# Patient Record
Sex: Female | Born: 1965 | Race: White | Hispanic: No | Marital: Single | Smoking: Former smoker
Health system: Southern US, Community
[De-identification: ages and names within clinical notes are randomized; demographics above are authoritative.]

## PROBLEM LIST (undated history)

## (undated) DIAGNOSIS — K219 Gastro-esophageal reflux disease without esophagitis: Secondary | ICD-10-CM

## (undated) DIAGNOSIS — M419 Scoliosis, unspecified: Secondary | ICD-10-CM

## (undated) DIAGNOSIS — F329 Major depressive disorder, single episode, unspecified: Secondary | ICD-10-CM

## (undated) DIAGNOSIS — M1990 Unspecified osteoarthritis, unspecified site: Secondary | ICD-10-CM

## (undated) DIAGNOSIS — D241 Benign neoplasm of right breast: Secondary | ICD-10-CM

## (undated) DIAGNOSIS — F419 Anxiety disorder, unspecified: Secondary | ICD-10-CM

## (undated) DIAGNOSIS — S73006A Unspecified dislocation of unspecified hip, initial encounter: Secondary | ICD-10-CM

## (undated) HISTORY — DX: Major depressive disorder, single episode, unspecified: F32.9

## (undated) HISTORY — DX: Benign neoplasm of right breast: D24.1

## (undated) HISTORY — PX: TONSILLECTOMY: SUR1361

## (undated) HISTORY — PX: BREAST EXCISIONAL BIOPSY: SUR124

## (undated) HISTORY — PX: WISDOM TOOTH EXTRACTION: SHX21

## (undated) HISTORY — DX: Scoliosis, unspecified: M41.9

## (undated) HISTORY — DX: Anxiety disorder, unspecified: F41.9

---

## 2010-06-15 ENCOUNTER — Ambulatory Visit: Payer: Self-pay

## 2010-11-15 ENCOUNTER — Ambulatory Visit: Payer: Self-pay | Admitting: Family Medicine

## 2011-04-07 ENCOUNTER — Ambulatory Visit: Payer: Self-pay

## 2012-05-08 ENCOUNTER — Ambulatory Visit: Payer: Self-pay

## 2012-05-28 ENCOUNTER — Ambulatory Visit: Payer: Self-pay | Admitting: Surgery

## 2012-05-31 LAB — PATHOLOGY REPORT

## 2012-08-30 ENCOUNTER — Ambulatory Visit: Payer: Self-pay | Admitting: Surgery

## 2013-03-22 ENCOUNTER — Ambulatory Visit: Payer: Self-pay | Admitting: Podiatry

## 2013-04-05 ENCOUNTER — Ambulatory Visit: Payer: Self-pay | Admitting: Podiatry

## 2013-04-16 ENCOUNTER — Ambulatory Visit: Payer: Self-pay | Admitting: Podiatry

## 2013-05-03 ENCOUNTER — Ambulatory Visit: Payer: No Typology Code available for payment source | Admitting: Podiatry

## 2013-05-10 ENCOUNTER — Ambulatory Visit (INDEPENDENT_AMBULATORY_CARE_PROVIDER_SITE_OTHER): Payer: No Typology Code available for payment source | Admitting: Podiatry

## 2013-05-10 ENCOUNTER — Encounter: Payer: Self-pay | Admitting: Podiatry

## 2013-05-10 VITALS — BP 110/69 | HR 89 | Resp 16 | Ht 63.0 in | Wt 135.0 lb

## 2013-05-10 DIAGNOSIS — L600 Ingrowing nail: Secondary | ICD-10-CM

## 2013-05-10 DIAGNOSIS — M7750 Other enthesopathy of unspecified foot: Secondary | ICD-10-CM

## 2013-05-10 DIAGNOSIS — M2040 Other hammer toe(s) (acquired), unspecified foot: Secondary | ICD-10-CM

## 2013-05-10 NOTE — Patient Instructions (Addendum)

## 2013-05-10 NOTE — Progress Notes (Signed)
Subjective:     Patient ID: Shelly Ward, female   DOB: 1965/09/12, 48 y.o.   MRN: [id number]  HPI patient states that I have ingrown toenails on both my big toes a lesion on the left third toe and painful feet in general secondary to my foot structure. States she's tried to trim out these nailbeds has had removed twice without permanent procedure and they continue to give her trouble   Review of Systems  All other systems reviewed and are negative.       Objective:   Physical Exam  Nursing note and vitals reviewed. Constitutional: She is oriented to person, place, and time.  Cardiovascular: Intact distal pulses.   Musculoskeletal: Normal range of motion.  Neurological: She is oriented to person, place, and time.  Skin: Skin is warm.   neurovascular status is found to be intact with muscle strength adequate range of motion within normal limits and no equinus condition noted. Patient is found to have incurvated hallux nails of both feet that are painful when pressed and is found to have keratotic lesion distal third left foot. I noted there to be discomfort in the metatarsal heads of both feet with pain along the tendon complex      Assessment:     Ingrown toenail deformity hallux of both feet along with tendinitis of both feet and hammertoe deformity third left foot    Plan:     H&P performed and conditions discussed. I have recommended removal of the ingrown toenails and explained procedures going over risks associated with surgery. I infiltrated each hallux 60 mg Xylocaine Marcaine mixture removed the medial borders exposed matrix and applied chemical phenol 3 applications 30 seconds followed by alcohol lavaged and sterile dressing. Debridement lesion third toe left and scanned for custom orthotics to reduce stress against both feet and we'll see back for orthotic dispense

## 2013-05-10 NOTE — Progress Notes (Signed)
   Subjective:    Patient ID: Shelly Ward, female    DOB: 04/14/1965, 48 y.o.   MRN: [id number]  HPI Comments: i have some trouble with ingrown toenails and also some toenails that are growing funny Right great toe medial corner was infected a while back but that had cleared up. The toenails on the left foot grow straight up .      Review of Systems  All other systems reviewed and are negative.       Objective:   Physical Exam        Assessment & Plan:

## 2013-05-22 ENCOUNTER — Encounter: Payer: Self-pay | Admitting: Podiatry

## 2013-05-24 ENCOUNTER — Ambulatory Visit (INDEPENDENT_AMBULATORY_CARE_PROVIDER_SITE_OTHER): Payer: No Typology Code available for payment source | Admitting: Podiatry

## 2013-05-24 ENCOUNTER — Ambulatory Visit: Payer: Self-pay | Admitting: Family Medicine

## 2013-05-24 VITALS — BP 116/72 | HR 91 | Resp 16 | Ht 63.0 in | Wt 138.0 lb

## 2013-05-24 DIAGNOSIS — M779 Enthesopathy, unspecified: Secondary | ICD-10-CM

## 2013-05-24 NOTE — Progress Notes (Signed)
Subjective:     Patient ID: Shelly Ward, female   DOB: 07-13-1965, 48 y.o.   MRN: [id number]  HPI patient presents with painful feet and is here to pick up insert   Review of Systems     Objective:   Physical Exam Neurovascular status intact with no change in health history and pain plantar feet that has not changed that much from previous visit    Assessment:     Plantar fasciitis tendinitis that is improved stable but still mildly to moderately symptomatic    Plan:     Dispensed orthotics with instructions and reviewed with her physical therapy and shoe gear modifications and reappoint 6 weeks

## 2013-05-24 NOTE — Patient Instructions (Signed)

## 2013-06-27 ENCOUNTER — Encounter: Payer: Self-pay | Admitting: Podiatry

## 2013-06-28 ENCOUNTER — Ambulatory Visit: Payer: No Typology Code available for payment source | Admitting: Podiatry

## 2014-06-30 ENCOUNTER — Ambulatory Visit
Admit: 2014-06-30 | Disposition: A | Discharge status: Home / Self Care | Payer: Self-pay | Attending: Urgent Care | Admitting: Urgent Care

## 2014-08-21 ENCOUNTER — Other Ambulatory Visit: Payer: Self-pay

## 2014-08-21 DIAGNOSIS — F419 Anxiety disorder, unspecified: Secondary | ICD-10-CM

## 2014-08-21 HISTORY — DX: Anxiety disorder, unspecified: F41.9

## 2014-08-21 MED ORDER — FLUOXETINE HCL 10 MG PO CAPS: 10.0000 mg | ORAL_CAPSULE | Freq: Two times a day (BID) | ORAL | Status: DC

## 2014-08-28 ENCOUNTER — Other Ambulatory Visit: Payer: Self-pay

## 2015-02-12 ENCOUNTER — Ambulatory Visit (INDEPENDENT_AMBULATORY_CARE_PROVIDER_SITE_OTHER): Payer: Self-pay | Admitting: Family Medicine

## 2015-02-12 ENCOUNTER — Encounter: Payer: Self-pay | Admitting: Family Medicine

## 2015-02-12 VITALS — BP 110/70 | HR 80 | Ht 63.0 in | Wt 145.0 lb

## 2015-02-12 DIAGNOSIS — S76911A Strain of unspecified muscles, fascia and tendons at thigh level, right thigh, initial encounter: Secondary | ICD-10-CM

## 2015-02-12 DIAGNOSIS — F419 Anxiety disorder, unspecified: Secondary | ICD-10-CM

## 2015-02-12 MED ORDER — ETODOLAC 400 MG PO TABS: 400.0000 mg | ORAL_TABLET | Freq: Two times a day (BID) | ORAL | Status: DC

## 2015-02-12 MED ORDER — FLUOXETINE HCL 10 MG PO CAPS: 10.0000 mg | ORAL_CAPSULE | Freq: Every day | ORAL | Status: DC

## 2015-02-12 NOTE — Progress Notes (Signed)
Name: Shelly Ward   MRN: [id number]    DOB: 08-29-1965   Date:02/12/2015       Progress Note  Subjective  Chief Complaint  Chief Complaint  Patient presents with  . Hip Pain    R) hip has been hurting off and on x 1 year but worse x 1 week    Hip Pain  The incident occurred more than 1 week ago. The pain is present in the right thigh, right hip and right knee. The quality of the pain is described as aching. The pain is at a severity of 5/10. The pain is moderate. The pain has been intermittent since onset. Associated symptoms include tingling. Pertinent negatives include no inability to bear weight, loss of motion, loss of sensation, muscle weakness or numbness. The symptoms are aggravated by movement. She has tried acetaminophen and NSAIDs (blue emu) for the symptoms. The treatment provided mild relief.  Depression        This is a chronic problem.  The current episode started more than 1 year ago.   The problem occurs every several days.  The problem has been gradually improving since onset.  Associated symptoms include no decreased concentration, no fatigue, no helplessness, no hopelessness, does not have insomnia, not irritable, no restlessness, no decreased interest, no appetite change, no body aches, no myalgias, no headaches, no indigestion, not sad and no suicidal ideas.     The symptoms are aggravated by nothing.  Past treatments include SSRIs - Selective serotonin reuptake inhibitors.   No problem-specific assessment & plan notes found for this encounter.   No past medical history on file.  Past Surgical History  Procedure Laterality Date  . Tonsillectomy      No family history on file.  Social History   Social History  . Marital Status: Single    Spouse Name: N/A  . Number of Children: N/A  . Years of Education: N/A   Occupational History  . Not on file.   Social History Main Topics  . Smoking status: Never Smoker   . Smokeless tobacco: Never Used  .  Alcohol Use: No  . Drug Use: No  . Sexual Activity: Not on file   Other Topics Concern  . Not on file   Social History Narrative    No Known Allergies   Review of Systems  Constitutional: Negative for fever, chills, weight loss, malaise/fatigue, appetite change and fatigue.  HENT: Negative for ear discharge, ear pain and sore throat.   Eyes: Negative for blurred vision.  Respiratory: Negative for cough, sputum production, shortness of breath and wheezing.   Cardiovascular: Negative for chest pain, palpitations and leg swelling.  Gastrointestinal: Negative for heartburn, nausea, abdominal pain, diarrhea, constipation, blood in stool and melena.  Genitourinary: Negative for dysuria, urgency, frequency and hematuria.  Musculoskeletal: Negative for myalgias, back pain, joint pain and neck pain.  Skin: Negative for rash.  Neurological: Positive for tingling. Negative for dizziness, sensory change, focal weakness, numbness and headaches.  Endo/Heme/Allergies: Negative for environmental allergies and polydipsia. Does not bruise/bleed easily.  Psychiatric/Behavioral: Negative for depression, suicidal ideas and decreased concentration. The patient is not nervous/anxious and does not have insomnia.      Objective  Filed Vitals:   02/12/15 1456  BP: 110/70  Pulse: 80  Height: 5\' 3"  (1.6 m)  Weight: 145 lb (65.772 kg)    Physical Exam  Constitutional: She is well-developed, well-nourished, and in no distress. She is not irritable. No distress.  HENT:  Head: Normocephalic and atraumatic.  Right Ear: External ear normal.  Left Ear: External ear normal.  Nose: Nose normal.  Mouth/Throat: Oropharynx is clear and moist.  Eyes: Conjunctivae and EOM are normal. Pupils are equal, round, and reactive to light. Right eye exhibits no discharge. Left eye exhibits no discharge.  Neck: Normal range of motion. Neck supple. No JVD present. No thyromegaly present.  Cardiovascular: Normal rate,  regular rhythm, normal heart sounds and intact distal pulses.  Exam reveals no gallop and no friction rub.   No murmur heard. Pulmonary/Chest: Effort normal and breath sounds normal.  Abdominal: Soft. Bowel sounds are normal. She exhibits no mass. There is no tenderness. There is no guarding.  Musculoskeletal: Normal range of motion. She exhibits no edema.  Lymphadenopathy:    She has no cervical adenopathy.  Neurological: She is alert. She has normal motor skills, normal sensation, normal strength and normal reflexes. She has a normal Straight Leg Raise Test.  Skin: Skin is warm and dry. She is not diaphoretic.  Psychiatric: Mood and affect normal.      Assessment & Plan  Problem List Items Addressed This Visit    None    Visit Diagnoses    Chronic anxiety    -  Primary    Relevant Medications    FLUoxetine (PROZAC) 10 MG capsule    Muscle strain of thigh, right, initial encounter        Relevant Medications    etodolac (LODINE) 400 MG tablet         Dr. Deanna Jones [facility]  02/12/2015

## 2015-02-19 ENCOUNTER — Other Ambulatory Visit: Payer: Self-pay

## 2015-02-19 DIAGNOSIS — M545 Low back pain: Secondary | ICD-10-CM

## 2015-02-19 MED ORDER — CYCLOBENZAPRINE HCL 10 MG PO TABS: 10.0000 mg | ORAL_TABLET | Freq: Three times a day (TID) | ORAL | Status: DC | PRN

## 2015-03-13 ENCOUNTER — Other Ambulatory Visit: Payer: Self-pay

## 2015-03-13 DIAGNOSIS — M25551 Pain in right hip: Secondary | ICD-10-CM

## 2015-03-18 ENCOUNTER — Other Ambulatory Visit: Payer: Self-pay | Admitting: Family Medicine

## 2015-03-19 ENCOUNTER — Other Ambulatory Visit: Payer: Self-pay

## 2015-03-19 MED ORDER — FLUOXETINE HCL 10 MG PO TABS: 10.0000 mg | ORAL_TABLET | Freq: Two times a day (BID) | ORAL | Status: DC

## 2015-05-31 ENCOUNTER — Ambulatory Visit
Admission: EM | Admit: 2015-05-31 | Discharge: 2015-05-31 | Disposition: A | Discharge status: Home / Self Care | Payer: Self-pay | Attending: Family Medicine | Admitting: Family Medicine

## 2015-05-31 DIAGNOSIS — M5441 Lumbago with sciatica, right side: Secondary | ICD-10-CM

## 2015-05-31 HISTORY — DX: Unspecified dislocation of unspecified hip, initial encounter: S73.006A

## 2015-05-31 HISTORY — DX: Anxiety disorder, unspecified: F41.9

## 2015-05-31 MED ORDER — CYCLOBENZAPRINE HCL 10 MG PO TABS: 10.0000 mg | ORAL_TABLET | Freq: Three times a day (TID) | ORAL | Status: DC | PRN

## 2015-05-31 NOTE — Discharge Instructions (Signed)
You may use the muscle relaxers again at bedtime for a few days as needed Use Ibuprofen 200 mg   2-3 tablets up to 3 x day with meals for 3 days  Take prednisone you already have 2 tablets today and 2 tomorrow Ice packs to right low back./buttocks  Back Exercises If you have pain in your back, do these exercises 2-3 times each day or as told by your doctor. When the pain goes away, do the exercises once each day, but repeat the steps more times for each exercise (do more repetitions). If you do not have pain in your back, do these exercises once each day or as told by your doctor. EXERCISES Single Knee to Chest Do these steps 3-5 times in a row for each leg: 1. Lie on your back on a firm bed or the floor with your legs stretched out. 2. Bring one knee to your chest. 3. Hold your knee to your chest by grabbing your knee or thigh. 4. Pull on your knee until you feel a gentle stretch in your lower back. 5. Keep doing the stretch for 10-30 seconds. 6. Slowly let go of your leg and straighten it. Pelvic Tilt Do these steps 5-10 times in a row: 1. Lie on your back on a firm bed or the floor with your legs stretched out. 2. Bend your knees so they point up to the ceiling. Your feet should be flat on the floor. 3. Tighten your lower belly (abdomen) muscles to press your lower back against the floor. This will make your tailbone point up to the ceiling instead of pointing down to your feet or the floor. 4. Stay in this position for 5-10 seconds while you gently tighten your muscles and breathe evenly. Cat-Cow Do these steps until your lower back bends more easily: 1. Get on your hands and knees on a firm surface. Keep your hands under your shoulders, and keep your knees under your hips. You may put padding under your knees. 2. Let your head hang down, and make your tailbone point down to the floor so your lower back is round like the back of a cat. 3. Stay in this position for 5 seconds. 4. Slowly  lift your head and make your tailbone point up to the ceiling so your back hangs low (sags) like the back of a cow. 5. Stay in this position for 5 seconds. Press-Ups Do these steps 5-10 times in a row: 1. Lie on your belly (face-down) on the floor. 2. Place your hands near your head, about shoulder-width apart. 3. While you keep your back relaxed and keep your hips on the floor, slowly straighten your arms to raise the top half of your body and lift your shoulders. Do not use your back muscles. To make yourself more comfortable, you may change where you place your hands. 4. Stay in this position for 5 seconds. 5. Slowly return to lying flat on the floor. Bridges Do these steps 10 times in a row: 1. Lie on your back on a firm surface. 2. Bend your knees so they point up to the ceiling. Your feet should be flat on the floor. 3. Tighten your butt muscles and lift your butt off of the floor until your waist is almost as high as your knees. If you do not feel the muscles working in your butt and the back of your thighs, slide your feet 1-2 inches farther away from your butt. 4. Stay in this position  for 3-5 seconds. 5. Slowly lower your butt to the floor, and let your butt muscles relax. If this exercise is too easy, try doing it with your arms crossed over your chest. Belly Crunches Do these steps 5-10 times in a row: 1. Lie on your back on a firm bed or the floor with your legs stretched out. 2. Bend your knees so they point up to the ceiling. Your feet should be flat on the floor. 3. Cross your arms over your chest. 4. Tip your chin a little bit toward your chest but do not bend your neck. 5. Tighten your belly muscles and slowly raise your chest just enough to lift your shoulder blades a tiny bit off of the floor. 6. Slowly lower your chest and your head to the floor. Back Lifts Do these steps 5-10 times in a row: 1. Lie on your belly (face-down) with your arms at your sides, and rest your  forehead on the floor. 2. Tighten the muscles in your legs and your butt. 3. Slowly lift your chest off of the floor while you keep your hips on the floor. Keep the back of your head in line with the curve in your back. Look at the floor while you do this. 4. Stay in this position for 3-5 seconds. 5. Slowly lower your chest and your face to the floor. GET HELP IF:  Your back pain gets a lot worse when you do an exercise.  Your back pain does not lessen 2 hours after you exercise. If you have any of these problems, stop doing the exercises. Do not do them again unless your doctor says it is okay. GET HELP RIGHT AWAY IF:  You have sudden, very bad back pain. If this happens, stop doing the exercises. Do not do them again unless your doctor says it is okay.   This information is not intended to replace advice given to you by your health care provider. Make sure you discuss any questions you have with your health care provider.   Document Released: 03/26/2010 Document Revised: 11/12/2014 Document Reviewed: 04/17/2014 Elsevier Interactive Patient Education [employer].

## 2015-05-31 NOTE — ED Provider Notes (Signed)
CSN: [id number]     Arrival date & time 05/31/15  [id number] History   First MD Initiated Contact with Patient 05/31/15 0845     Chief Complaint  Patient presents with  . Leg Pain    Specifically Right Ankle, Knee, Hip, and lower back   (Consider location/radiation/quality/duration/timing/severity/associated sxs/prior Treatment) HPI  50 yo F reports longstanding issues with low back, leg and hip  Pain. Generally  right sided. At times the knee can be uncomfortable as well. Today there is pain similar to past experience Today the ankle is also aching. She denies any recent trauma, and denies any recognized mis-step or stumble.  She can weightbear. Is ambulatory without assistance and drove herself here. 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  Reports using cyclobenzaprine at bedtime in the past intermittently  with good relief and good sleep. She brings and empty bottle of the same. And a bottle of a prednisone taper that still contains 2 pills she failed to finish. Also wants to ask about a long standing bunion on the right. Past Medical History  Diagnosis Date  . Anxiety   . Dislocated hip ([place])     Right Side at [facility]   Past Surgical History  Procedure Laterality Date  . Tonsillectomy     History reviewed. No pertinent family history. Social History  Substance Use Topics  . Smoking status: Never Smoker   . Smokeless tobacco: Never Used  . Alcohol Use: No   OB History    No data available     Review of Systems 10 Systems reviewed and are negative for acute change except as noted in the HPI.  Allergies  Review of patient's allergies indicates no known allergies.  Home Medications   Prior to Admission medications   Medication Sig Start Date End Date Taking? Authorizing Provider  cyclobenzaprine (FLEXERIL) 10 MG tablet Take 1 tablet (10 mg total) by mouth 3 (three) times daily as needed for muscle spasms. 05/31/15   Jan Fireman, PA-C  etodolac (LODINE) 400 MG  tablet Take 1 tablet (400 mg total) by mouth 2 (two) times daily. 02/12/15   Juline Patch, MD  FLUoxetine (PROZAC) 10 MG tablet Take 1 tablet (10 mg total) by mouth 2 (two) times daily. 03/19/15   Juline Patch, MD  Multiple Vitamin (MULTI-VITAMINS) TABS Take 1 tablet by mouth daily.    Historical Provider, MD   Meds Ordered and Administered this Visit  Medications - No data to display  BP 128/77 mmHg  Pulse 86  Temp(Src) 97.9 F (36.6 C) (Oral)  Resp 18  Ht 5\' 3"  (1.6 m)  Wt 135 lb (61.236 kg)  BMI 23.92 kg/m2  SpO2 100%  LMP 05/10/2015 No data found.   Physical Exam    General: NAD,  HEENT:normocephalic,atraumatic, mucous membranes moist,grossly normal hearing Eyes: EOMI, conjunctiva clear, conjugate gaze Neck: supple,no lymphadenopathy Resp : CT A, bilat; normal respiratory effort Card : RRR Abd:  Not distended Skin: no rash, skin intact MSK: no deformities, ambulatory without assistance. Indicates right low back sciatic region as area of discomfort; area somewhat tender to pressure palaption Right buttocks, right lateral thigh reported  as aching, no change with exam or activity. Can toe walk, heel walk, crouch and return without assistance. SLR is neg DTRs present and equal, good pulses. Plants right foot slightly differently than left with ambulation but travels without hesitancy. Wearing sneakers. Right foot with mild/mod bunion developing, some toe deviation-feet hurt on the job  Neuro : good attention,recall-good memory, fine tremor noted - patient denies awareness Psych: speech and behavior appropriate   ED Course  Procedures (including critical care time)  Labs Review Labs Reviewed - No data to display  Imaging Review No results found.  Discussed body mechanics and twist, torque, lift exacerbations for low back/sciatica problems. Anti-inflammatories and ice as needed. For the acute use one of her prednisone tonight and one tomorrow night. Cautioned that  in future needs to take complete tapers as directed-designed to step down. May use muscle relaxer at bedtime when needed  MDM   1. Right-sided low back pain with right-sided sciatica    Diagnosis and treatment discussed.-handout given-heat, ice, anti-inflammatories, athletic rubs-body mechanics !   Questions fielded, expectations and recommendations reviewed. Discussed follow up and return parameters including no resolution or any worsening condition. . Patient expresses understanding  and agrees to plan.  Will return to [facility] with questions, concerns or exacerbation.  Return to PCP [facility] care for longstanding problems as she will be better served w continuum of care.  Cyclobenaprine Rx submitted   Discharge Medication List as of 05/31/2015  9:08 AM        Jan Fireman, PA-C 06/03/15 1551

## 2015-05-31 NOTE — ED Notes (Addendum)
Patient c/o right ankle pain and pain behind her right knee which radiates to her hip, buttocks, and lower back.  Also, experiences numbness and tingling at times in her right ankle and right foot.  This pain has been happening on and off now for about a year now.  No injury or trauma.  No past surgery to the back or right leg.

## 2015-06-03 ENCOUNTER — Encounter: Payer: Self-pay | Admitting: Physician Assistant

## 2015-06-10 ENCOUNTER — Ambulatory Visit: Payer: Self-pay

## 2015-06-17 ENCOUNTER — Encounter: Payer: Self-pay | Admitting: *Deleted

## 2015-06-17 ENCOUNTER — Ambulatory Visit: Payer: Self-pay | Attending: Oncology | Admitting: *Deleted

## 2015-06-17 ENCOUNTER — Encounter (INDEPENDENT_AMBULATORY_CARE_PROVIDER_SITE_OTHER): Payer: Self-pay

## 2015-06-17 ENCOUNTER — Ambulatory Visit
Admission: RE | Admit: 2015-06-17 | Discharge: 2015-06-17 | Disposition: A | Discharge status: Home / Self Care | Payer: Self-pay | Source: Ambulatory Visit | Attending: Oncology | Admitting: Oncology

## 2015-06-17 VITALS — BP 108/68 | HR 80 | Temp 97.6°F | Resp 16 | Ht 62.99 in | Wt 140.7 lb

## 2015-06-17 DIAGNOSIS — Z0000 Encounter for general adult medical examination without abnormal findings: Secondary | ICD-10-CM

## 2015-06-17 NOTE — Patient Instructions (Signed)
Human Papillomavirus Human papillomavirus (HPV) is the most common sexually transmitted infection (STI) and is highly contagious. HPV infections cause genital warts and cancers to the outlet of the womb (cervix), birth canal (vagina), opening of the birth canal (vulva), and anus. There are over 100 types of HPV. Unless wartlike lesions are present in the throat or there are genital warts that you can see or feel, HPV usually does not cause symptoms. It is possible to be infected for long periods and pass it on to others without knowing it. CAUSES  HPV is spread from person to person through sexual contact. This includes oral, vaginal, or anal sex. RISK FACTORS  Having unprotected sex. HPV can be spread by oral, vaginal, or anal sex.  Having several sex partners.  Having a sex partner who has other sex partners.  Having or having had another sexually transmitted infection. SIGNS AND SYMPTOMS  Most people carrying HPV do not have any symptoms. If symptoms are present, symptoms may include:  Wartlike lesions in the throat (from having oral sex).  Warts in the infected skin or mucous membranes.  Genital warts that may itch, burn, or bleed.  Genital warts that may be painful or bleed during sexual intercourse. DIAGNOSIS  If wartlike lesions are present in the throat or genital warts are present, your health care provider can usually diagnose HPV by physical examination.   Genital warts are easily seen with the naked eye.  Currently, there is no FDA-approved test to detect HPV in males.  In females, a Pap test can show cells that are infected with HPV.  In females, a scope can be used to view the cervix (colposcopy). A colposcopy can be performed if the pelvic exam or Pap test is abnormal. A sample of tissue may be removed (biopsy) during the colposcopy. TREATMENT  There is no treatment for the virus itself. However, there are treatments for the health problems and symptoms HPV can cause.  Your health care provider will follow you closely after you are treated. This is because the HPV can come back and may need treatment again. Treatment of HPV may include:   Medicines, which may be injected or applied in a cream, lotion, or gel form.  Use of a probe to apply extreme cold (cryotherapy).  Application of an intense beam of light (laser treatment).  Use of a probe to apply extreme heat (electrocautery).  Surgery. HOME CARE INSTRUCTIONS   Take medicines only as directed by your health care provider.  Use over-the-counter creams for itching or irritation as directed by your health care provider.  Keep all follow-up visits as directed by your health care provider. This is important.  Do not touch or scratch the warts.  Do not treat genital warts with medicines used for treating hand warts.  Do not have sex while you are being treated.  Do not douche or use tampons during treatment of HPV.  Tell your sex partner about your infection because he or she may also need treatment.  If you become pregnant, tell your health care provider that you have had HPV. Your health care provider will monitor you closely during pregnancy to be sure your baby is safe.  After treatment, use condoms during sex to prevent future infections.  Have only one sex partner.  Have a sex partner who does not have other sex partners. PREVENTION   Talk to your health care provider about getting the HPV vaccines. These vaccines prevent some HPV infections and cancers.  It is recommended that the vaccine be given to males and females between the ages of 9 and 26 years old. It will not work if you already have HPV, and it is not recommended for pregnant women.  A Pap test is done to screen for cervical cancer in women.  The first Pap test should be done at age 21 years.  Between ages 21 and 29 years, Pap tests are repeated every 2 years.  Beginning at age 30, you are advised to have a Pap test every  3 years as long as your past 3 Pap tests have been normal.  Some women have medical problems that increase the chance of getting cervical cancer. Talk to your health care provider about these problems. It is especially important to talk to your health care provider if a new problem develops soon after your last Pap test. In these cases, your health care provider may recommend more frequent screening and Pap tests.  The above recommendations are the same for women who have or have not gotten the vaccine for HPV.  If you had a hysterectomy for a problem that was not a cancer or a condition that could lead to cancer, then you no longer need Pap tests. However, even if you no longer need a Pap test, a regular exam is a good idea to make sure no other problems are starting.   If you are between the ages of 65 and 70 years and you have had normal Pap tests going back 10 years, you no longer need Pap tests. However, even if you no longer need a Pap test, a regular exam is a good idea to make sure no other problems are starting.  If you have had past treatment for cervical cancer or a condition that could lead to cancer, you need Pap tests and screening for cancer for at least 20 years after your treatment.  If Pap tests have been discontinued, risk factors (such as a new sexual partner)need to be reassessed to determine if screening should be resumed.  Some women may need screenings more often if they are at high risk for cervical cancer. SEEK MEDICAL CARE IF:   The treated skin becomes red, swollen, or painful.  You have a fever.  You feel generally ill.  You feel lumps or pimple-like projections in and around your genital area.  You develop bleeding of the vagina or the treatment area.  You have painful sexual intercourse. MAKE SURE YOU:   Understand these instructions.  Will watch your condition.  Will get help if you are not doing well or get worse.   This information is not  intended to replace advice given to you by your health care provider. Make sure you discuss any questions you have with your health care provider.   Document Released: 05/14/2003 Document Revised: 03/14/2014 Document Reviewed: 05/29/2013 Elsevier Interactive Patient Education 2016 [employer]vier Inc[employer]   Gave patient hand-out, Women Staying Healthy, Active and Well from [facility], with education on breast health, pap smears, heart and colon health. 

## 2015-06-17 NOTE — Progress Notes (Signed)
Subjective:     Patient ID: Shelly Ward, female   DOB: 02-08-1966, 50 y.o.   MRN: [id number]  HPI   Review of Systems     Objective:   Physical Exam  Pulmonary/Chest: Right breast exhibits no inverted nipple, no mass, no nipple discharge, no skin change and no tenderness. Left breast exhibits no inverted nipple, no mass, no nipple discharge, no skin change and no tenderness.  Abdominal: There is no splenomegaly or hepatomegaly.  Genitourinary: Rectal exam shows no internal hemorrhoid. There is no rash, tenderness, lesion or injury on the right labia. There is no rash, tenderness, lesion or injury on the left labia. Cervix exhibits no motion tenderness, no discharge and no friability. Right adnexum displays no mass, no tenderness and no fullness. Left adnexum displays no mass and no fullness. No erythema, tenderness or bleeding in the vagina. No foreign body around the vagina. No signs of injury around the vagina. No vaginal discharge found.  Firm dark brown stool noted on rectal exam       Assessment:     50 year old White female returns to [facility] for annual screening.  Clinical breast exam unremarkable.  Taught self breast awareness.  Specimen collected for pap smear with some difficulty.  States she has not been sexually active in a long time.  Complains of painful intercourse.   Discussed vaginal dilators and lubricants to increase comfort during intercourse. Patient has been screened for eligibility.  She does not have any insurance, Medicare or Medicaid.  She also meets financial eligibility.  Hand-out given on the Affordable Care Act.     Plan:     Screening mammogram ordered.  Specimen sent to the lab.  Will follow-up per [facility] protocol.

## 2015-06-22 LAB — PAP LB AND HPV HIGH-RISK
HPV, high-risk: NEGATIVE
PAP SMEAR COMMENT: 0

## 2015-06-23 ENCOUNTER — Encounter: Payer: Self-pay | Admitting: *Deleted

## 2015-06-23 NOTE — Progress Notes (Signed)
Letter mailed to inform patient of her normal mammogram and pap smear.  Next mammo due in one year and pap due in 5 years.  HSIS to Christy. 

## 2015-09-28 ENCOUNTER — Other Ambulatory Visit: Payer: Self-pay | Admitting: Family Medicine

## 2016-01-27 ENCOUNTER — Ambulatory Visit (INDEPENDENT_AMBULATORY_CARE_PROVIDER_SITE_OTHER): Payer: Self-pay | Admitting: Family Medicine

## 2016-01-27 ENCOUNTER — Ambulatory Visit: Payer: Self-pay | Admitting: Family Medicine

## 2016-01-27 ENCOUNTER — Encounter: Payer: Self-pay | Admitting: Family Medicine

## 2016-01-27 VITALS — BP 120/78 | HR 84 | Ht 62.0 in | Wt 139.0 lb

## 2016-01-27 DIAGNOSIS — F419 Anxiety disorder, unspecified: Secondary | ICD-10-CM

## 2016-01-27 DIAGNOSIS — J40 Bronchitis, not specified as acute or chronic: Secondary | ICD-10-CM

## 2016-01-27 DIAGNOSIS — J0100 Acute maxillary sinusitis, unspecified: Secondary | ICD-10-CM

## 2016-01-27 MED ORDER — FLUOXETINE HCL 10 MG PO TABS: 10.0000 mg | ORAL_TABLET | Freq: Two times a day (BID) | ORAL | 5 refills | Status: DC

## 2016-01-27 MED ORDER — AMOXICILLIN 500 MG PO CAPS: 500.0000 mg | ORAL_CAPSULE | Freq: Three times a day (TID) | ORAL | 0 refills | Status: DC

## 2016-01-27 MED ORDER — FLUOXETINE HCL 10 MG PO TABS: 10.0000 mg | ORAL_TABLET | Freq: Two times a day (BID) | ORAL | 1 refills | Status: DC

## 2016-01-27 MED ORDER — GUAIFENESIN-CODEINE 100-10 MG/5ML PO SYRP: 5.0000 mL | ORAL_SOLUTION | Freq: Three times a day (TID) | ORAL | 0 refills | Status: DC | PRN

## 2016-01-27 NOTE — Progress Notes (Signed)
Name: Shelly Ward   MRN: [id number]    DOB: 06/25/1965   Date:01/27/2016       Progress Note  Subjective  Chief Complaint  Chief Complaint  Patient presents with  . Sinusitis    cough and cong x 2 weeks- mucinex hasn't helped  . Anxiety    refill Fluoxetine    Sinusitis  This is a new problem. The current episode started 1 to 4 weeks ago. The problem has been gradually worsening since onset. There has been no fever. The pain is mild. Associated symptoms include chills, congestion, coughing, headaches, sinus pressure and a sore throat. Pertinent negatives include no diaphoresis, ear pain, hoarse voice, neck pain, shortness of breath, sneezing or swollen glands. Treatments tried: mucinex d. The treatment provided mild relief.  Anxiety  Presents for follow-up visit. Symptoms include nervous/anxious behavior. Patient reports no chest pain, compulsions, confusion, decreased concentration, depressed mood, dizziness, dry mouth, excessive worry, feeling of choking, hyperventilation, impotence, insomnia, irritability, malaise, muscle tension, nausea, palpitations, panic, restlessness, shortness of breath or suicidal ideas. Symptoms occur occasionally. The severity of symptoms is mild. The quality of sleep is good. Nighttime awakenings: none.      No problem-specific Assessment & Plan notes found for this encounter.   Past Medical History:  Diagnosis Date  . Anxiety   . Dislocated hip ([place])    Right Side at [facility]    Past Surgical History:  Procedure Laterality Date  . BREAST BIOPSY Right   . TONSILLECTOMY      No family history on file.  Social History   Social History  . Marital status: Single    Spouse name: N/A  . Number of children: N/A  . Years of education: N/A   Occupational History  . Not on file.   Social History Main Topics  . Smoking status: Never Smoker  . Smokeless tobacco: Never Used  . Alcohol use No  . Drug use: No  . Sexual activity: Not on file    Other Topics Concern  . Not on file   Social History Narrative  . No narrative on file    No Known Allergies   Review of Systems  Constitutional: Positive for chills. Negative for diaphoresis, fever, irritability, malaise/fatigue and weight loss.  HENT: Positive for congestion, sinus pressure and sore throat. Negative for ear discharge, ear pain, hoarse voice and sneezing.   Eyes: Negative for blurred vision.  Respiratory: Positive for cough. Negative for sputum production, shortness of breath and wheezing.   Cardiovascular: Negative for chest pain, palpitations and leg swelling.  Gastrointestinal: Negative for abdominal pain, blood in stool, constipation, diarrhea, heartburn, melena and nausea.  Genitourinary: Negative for dysuria, frequency, hematuria, impotence and urgency.  Musculoskeletal: Negative for back pain, joint pain, myalgias and neck pain.  Skin: Negative for rash.  Neurological: Positive for headaches. Negative for dizziness, tingling, sensory change and focal weakness.  Endo/Heme/Allergies: Negative for environmental allergies and polydipsia. Does not bruise/bleed easily.  Psychiatric/Behavioral: Negative for confusion, decreased concentration, depression and suicidal ideas. The patient is nervous/anxious. The patient does not have insomnia.      Objective  Vitals:   01/27/16 0849  BP: 120/78  Pulse: 84  Weight: 139 lb (63 kg)  Height: 5\' 2"  (1.575 m)    Physical Exam  Constitutional: She is well-developed, well-nourished, and in no distress. No distress.  HENT:  Head: Normocephalic and atraumatic.  Right Ear: External ear normal.  Left Ear: External ear normal.  Nose: Nose normal.  Right sinus exhibits no maxillary sinus tenderness. Left sinus exhibits no maxillary sinus tenderness.  Mouth/Throat: Oropharynx is clear and moist.  Eyes: Conjunctivae and EOM are normal. Pupils are equal, round, and reactive to light. Right eye exhibits no discharge. Left  eye exhibits no discharge.  Neck: Normal range of motion. Neck supple. No JVD present. No thyromegaly present.  Cardiovascular: Normal rate, regular rhythm, normal heart sounds and intact distal pulses.  Exam reveals no gallop and no friction rub.   No murmur heard. Pulmonary/Chest: Effort normal and breath sounds normal.  Abdominal: Soft. Bowel sounds are normal. She exhibits no mass. There is no tenderness. There is no guarding.  Musculoskeletal: Normal range of motion. She exhibits no edema.  Lymphadenopathy:    She has no cervical adenopathy.  Neurological: She is alert.  Skin: Skin is warm and dry. She is not diaphoretic.  Psychiatric: Mood and affect normal.  Nursing note and vitals reviewed.     Assessment & Plan  Problem List Items Addressed This Visit      Other   Acute anxiety   Relevant Medications   FLUoxetine (PROZAC) 10 MG tablet    Other Visit Diagnoses    Acute maxillary sinusitis, recurrence not specified    -  Primary   Relevant Medications   amoxicillin (AMOXIL) 500 MG capsule   guaiFENesin-codeine (ROBITUSSIN AC) 100-10 MG/5ML syrup   Bronchitis       Relevant Medications   guaiFENesin-codeine (ROBITUSSIN AC) 100-10 MG/5ML syrup        Dr. [facility]  01/27/16

## 2016-03-31 ENCOUNTER — Ambulatory Visit (INDEPENDENT_AMBULATORY_CARE_PROVIDER_SITE_OTHER): Payer: Self-pay | Admitting: Family Medicine

## 2016-03-31 ENCOUNTER — Encounter: Payer: Self-pay | Admitting: Family Medicine

## 2016-03-31 VITALS — BP 130/80 | HR 78 | Ht 62.0 in | Wt 138.0 lb

## 2016-03-31 DIAGNOSIS — M791 Myalgia, unspecified site: Secondary | ICD-10-CM

## 2016-03-31 MED ORDER — IBUPROFEN 600 MG PO TABS: 600.0000 mg | ORAL_TABLET | Freq: Three times a day (TID) | ORAL | 0 refills | Status: DC | PRN

## 2016-03-31 NOTE — Progress Notes (Signed)
Name: Shelly Ward   MRN: [id number]    DOB: 04/13/1965   Date:03/31/2016       Progress Note  Subjective  Chief Complaint  Chief Complaint  Patient presents with  . Leg Pain    hurting in the "groin" of the L) leg x 1 week- has been to ortho surg for R) leg pain    Leg Pain   The incident occurred 5 to 7 days ago (pulling on truck door). The injury mechanism is unknown. The pain is present in the left leg. The quality of the pain is described as aching. The pain is at a severity of 5/10. The pain is moderate. The pain has been fluctuating since onset. Pertinent negatives include no inability to bear weight, loss of motion, loss of sensation, muscle weakness, numbness or tingling. The symptoms are aggravated by palpation. The treatment provided no relief.    No problem-specific Assessment & Plan notes found for this encounter.   Past Medical History:  Diagnosis Date  . Anxiety   . Dislocated hip ([place])    Right Side at [facility]    Past Surgical History:  Procedure Laterality Date  . BREAST BIOPSY Right   . TONSILLECTOMY      History reviewed. No pertinent family history.  Social History   Social History  . Marital status: Single    Spouse name: N/A  . Number of children: N/A  . Years of education: N/A   Occupational History  . Not on file.   Social History Main Topics  . Smoking status: Never Smoker  . Smokeless tobacco: Never Used  . Alcohol use No  . Drug use: No  . Sexual activity: Not on file   Other Topics Concern  . Not on file   Social History Narrative  . No narrative on file    No Known Allergies   Review of Systems  Constitutional: Negative for chills, fever, malaise/fatigue and weight loss.  HENT: Negative for ear discharge, ear pain and sore throat.   Eyes: Negative for blurred vision.  Respiratory: Negative for cough, sputum production, shortness of breath and wheezing.   Cardiovascular: Negative for chest pain, palpitations and leg  swelling.  Gastrointestinal: Negative for abdominal pain, blood in stool, constipation, diarrhea, heartburn, melena and nausea.  Genitourinary: Negative for dysuria, frequency, hematuria and urgency.  Musculoskeletal: Negative for back pain, joint pain, myalgias and neck pain.  Skin: Negative for rash.  Neurological: Negative for dizziness, tingling, sensory change, focal weakness, numbness and headaches.  Endo/Heme/Allergies: Negative for environmental allergies and polydipsia. Does not bruise/bleed easily.  Psychiatric/Behavioral: Negative for depression and suicidal ideas. The patient is not nervous/anxious and does not have insomnia.      Objective  Vitals:   03/31/16 1357  BP: 130/80  Pulse: 78  Weight: 138 lb (62.6 kg)  Height: 5\' 2"  (1.575 m)    Physical Exam  Constitutional: She is well-developed, well-nourished, and in no distress. No distress.  HENT:  Head: Normocephalic and atraumatic.  Right Ear: External ear normal.  Left Ear: External ear normal.  Nose: Nose normal.  Mouth/Throat: Oropharynx is clear and moist.  Eyes: Conjunctivae and EOM are normal. Pupils are equal, round, and reactive to light. Right eye exhibits no discharge. Left eye exhibits no discharge.  Neck: Normal range of motion. Neck supple. No JVD present. No thyromegaly present.  Cardiovascular: Normal rate, regular rhythm, normal heart sounds and intact distal pulses.  Exam reveals no gallop and no friction rub.  No murmur heard. Pulmonary/Chest: Effort normal and breath sounds normal. She has no wheezes. She has no rales.  Abdominal: Soft. Bowel sounds are normal. There is no tenderness.  Musculoskeletal: She exhibits no edema.       Left hip: She exhibits decreased range of motion.  Pain with internal/external rotation left hip  Lymphadenopathy:    She has no cervical adenopathy.  Neurological: She is alert.  Skin: Skin is warm and dry. She is not diaphoretic.  Psychiatric: Mood and affect  normal.  Nursing note and vitals reviewed.     Assessment & Plan  Problem List Items Addressed This Visit    None    Visit Diagnoses    Myalgia    -  Primary   pt does not think it is her hip   Relevant Medications   ibuprofen (ADVIL,MOTRIN) 600 MG tablet        Dr. [facility]  03/31/16

## 2016-08-31 ENCOUNTER — Ambulatory Visit
Admission: RE | Admit: 2016-08-31 | Discharge: 2016-08-31 | Disposition: A | Discharge status: Home / Self Care | Payer: Self-pay | Source: Ambulatory Visit | Attending: Family Medicine | Admitting: Family Medicine

## 2016-08-31 ENCOUNTER — Ambulatory Visit (INDEPENDENT_AMBULATORY_CARE_PROVIDER_SITE_OTHER): Payer: Self-pay | Admitting: Family Medicine

## 2016-08-31 ENCOUNTER — Encounter: Payer: Self-pay | Admitting: Family Medicine

## 2016-08-31 VITALS — BP 127/88 | HR 86 | Temp 97.6°F | Ht 62.0 in | Wt 137.0 lb

## 2016-08-31 DIAGNOSIS — X58XXXA Exposure to other specified factors, initial encounter: Secondary | ICD-10-CM | POA: Insufficient documentation

## 2016-08-31 DIAGNOSIS — S93422A Sprain of deltoid ligament of left ankle, initial encounter: Secondary | ICD-10-CM | POA: Insufficient documentation

## 2016-08-31 DIAGNOSIS — M7989 Other specified soft tissue disorders: Secondary | ICD-10-CM | POA: Insufficient documentation

## 2016-08-31 MED ORDER — TRAMADOL HCL 50 MG PO TABS: 50.0000 mg | ORAL_TABLET | Freq: Three times a day (TID) | ORAL | 0 refills | Status: DC | PRN

## 2016-08-31 NOTE — Progress Notes (Signed)
Name: Shelly Ward   MRN: [id number]    DOB: Oct 13, 1965   Date:09/01/2016       Progress Note  Subjective  Chief Complaint  Chief Complaint  Patient presents with  . Foot Injury    Pt stated injured Lt foot have bruise/pain yesterday    Foot Injury   The incident occurred 6 to 12 hours ago. The incident occurred at home. The injury mechanism was a fall. The pain is present in the left ankle. The quality of the pain is described as aching. The pain is at a severity of 5/10. The pain is moderate. The pain has been intermittent since onset. Associated symptoms include an inability to bear weight and a loss of motion. Pertinent negatives include no loss of sensation, muscle weakness, numbness or tingling. The symptoms are aggravated by movement. She has tried acetaminophen and NSAIDs for the symptoms. The treatment provided moderate relief.    No problem-specific Assessment & Plan notes found for this encounter.   Past Medical History:  Diagnosis Date  . Anxiety   . Dislocated hip ([place])    Right Side at [facility]    Past Surgical History:  Procedure Laterality Date  . BREAST BIOPSY Right   . TONSILLECTOMY      No family history on file.  Social History   Social History  . Marital status: Single    Spouse name: N/A  . Number of children: N/A  . Years of education: N/A   Occupational History  . Not on file.   Social History Main Topics  . Smoking status: Never Smoker  . Smokeless tobacco: Never Used  . Alcohol use No  . Drug use: No  . Sexual activity: Not on file   Other Topics Concern  . Not on file   Social History Narrative  . No narrative on file    No Known Allergies  Outpatient Medications Prior to Visit  Medication Sig Dispense Refill  . diclofenac (VOLTAREN) 75 MG EC tablet Take 75 mg by mouth 2 (two) times daily. Dr Merrilyn Puma    . FLUoxetine (PROZAC) 10 MG tablet Take 1 tablet (10 mg total) by mouth 2 (two) times daily. 60 tablet 5  . ibuprofen  (ADVIL,MOTRIN) 600 MG tablet Take 1 tablet (600 mg total) by mouth every 8 (eight) hours as needed. 90 tablet 0  . Multiple Vitamin (MULTI-VITAMINS) TABS Take 1 tablet by mouth daily.     No facility-administered medications prior to visit.     Review of Systems  Constitutional: Negative for chills, fever, malaise/fatigue and weight loss.  HENT: Negative for ear discharge, ear pain and sore throat.   Eyes: Negative for blurred vision.  Respiratory: Negative for cough, sputum production, shortness of breath and wheezing.   Cardiovascular: Negative for chest pain, palpitations and leg swelling.  Gastrointestinal: Negative for abdominal pain, blood in stool, constipation, diarrhea, heartburn, melena and nausea.  Genitourinary: Negative for dysuria, frequency, hematuria and urgency.  Musculoskeletal: Positive for falls and joint pain. Negative for back pain, myalgias and neck pain.  Skin: Negative for rash.  Neurological: Negative for dizziness, tingling, sensory change, focal weakness, numbness and headaches.  Endo/Heme/Allergies: Negative for environmental allergies and polydipsia. Does not bruise/bleed easily.  Psychiatric/Behavioral: Negative for depression and suicidal ideas. The patient is not nervous/anxious and does not have insomnia.      Objective  Vitals:   08/31/16 1016  BP: 127/88  Pulse: 86  Temp: 97.6 F (36.4 C)  SpO2: 97%  Weight: 137 lb (62.1 kg)  Height: 5\' 2"  (1.575 m)    Physical Exam  Constitutional: She is well-developed, well-nourished, and in no distress. No distress.  HENT:  Head: Normocephalic and atraumatic.  Right Ear: External ear normal.  Left Ear: External ear normal.  Nose: Nose normal.  Mouth/Throat: Oropharynx is clear and moist.  Eyes: Conjunctivae and EOM are normal. Pupils are equal, round, and reactive to light. Right eye exhibits no discharge. Left eye exhibits no discharge.  Neck: Normal range of motion. Neck supple. No JVD present. No  thyromegaly present.  Cardiovascular: Normal rate, regular rhythm, normal heart sounds and intact distal pulses.  Exam reveals no gallop and no friction rub.   No murmur heard. Pulmonary/Chest: Effort normal and breath sounds normal. She has no wheezes. She has no rales.  Abdominal: Soft. Bowel sounds are normal. She exhibits no mass. There is no tenderness. There is no guarding.  Musculoskeletal: Normal range of motion. She exhibits no edema.       Left ankle: She exhibits swelling. Tenderness. Medial malleolus tenderness found.       Feet:  Ecchymosis/tender deloid ligament  Lymphadenopathy:    She has no cervical adenopathy.  Neurological: She is alert. She has normal reflexes.  Skin: Skin is warm and dry. She is not diaphoretic.  Psychiatric: Mood and affect normal.  Nursing note and vitals reviewed.     Assessment & Plan  Problem List Items Addressed This Visit    None    Visit Diagnoses    Sprain, ankle joint, medial, left, initial encounter    -  Primary   Relevant Medications   traMADol (ULTRAM) 50 MG tablet   Other Relevant Orders   DG Ankle Complete Left (Completed)      Meds ordered this encounter  Medications  . traMADol (ULTRAM) 50 MG tablet    Sig: Take 1 tablet (50 mg total) by mouth every 8 (eight) hours as needed.    Dispense:  30 tablet    Refill:  0      Dr. Jolean [facility]ari[facility]  09/01/16

## 2016-08-31 NOTE — Patient Instructions (Signed)
Ankle Sprain An ankle sprain is a stretch or tear in one of the tough, fiber-like tissues (ligaments) in the ankle. The ligaments in your ankle help to hold the bones of the ankle together. What are the causes? This condition is often caused by stepping on or falling on the outer edge of the foot. What increases the risk? This condition is more likely to develop in people who play sports. What are the signs or symptoms? Symptoms of this condition include:  Pain in your ankle.  Swelling.  Bruising. Bruising may develop right after you sprain your ankle or 1-2 days later.  Trouble standing or walking, especially when you turn or change directions. How is this diagnosed? This condition is diagnosed with a physical exam. During the exam, your health care provider will press on certain parts of your foot and ankle and try to move them in certain ways. X-rays may be taken to see how severe the sprain is and to check for broken bones. How is this treated? This condition may be treated with:  A brace. This is used to keep the ankle from moving until it heals.  An elastic bandage. This is used to support the ankle.  Crutches.  Pain medicine.  Surgery. This may be needed if the sprain is severe.  Physical therapy. This may help to improve the range of motion in the ankle. Follow these instructions at home:  Rest your ankle.  Take over-the-counter and prescription medicines only as told by your health care provider.  For 2-3 days, keep your ankle raised (elevated) above the level of your heart as much as possible.  If directed, apply ice to the area:  Put ice in a plastic bag.  Place a towel between your skin and the bag.  Leave the ice on for 20 minutes, 2-3 times a day.  If you were given a brace:  Wear it as directed.  Remove it to shower or bathe.  Try not to move your ankle much, but wiggle your toes from time to time. This helps to prevent swelling.  If you were  given an elastic bandage (dressing):  Remove it to shower or bathe.  Try not to move your ankle much, but wiggle your toes from time to time. This helps to prevent swelling.  Adjust the dressing to make it more comfortable if it feels too tight.  Loosen the dressing if you have numbness or tingling in your foot, or if your foot becomes cold and blue.  If you have crutches, use them as told by your health care provider. Continue to use them until you can walk without feeling pain in your ankle. Contact a health care provider if:  You have rapidly increasing bruising or swelling.  Your pain is not relieved with medicine. Get help right away if:  Your toes or foot becomes numb or blue.  You have severe pain that gets worse. This information is not intended to replace advice given to you by your health care provider. Make sure you discuss any questions you have with your health care provider. Document Released: 02/21/2005 Document Revised: 07/01/2015 Document Reviewed: 09/23/2014 Elsevier Interactive Patient Education  2017 Elsevier Inc.  

## 2016-10-10 ENCOUNTER — Ambulatory Visit: Payer: Self-pay

## 2016-11-14 ENCOUNTER — Ambulatory Visit: Payer: Self-pay

## 2016-12-12 ENCOUNTER — Other Ambulatory Visit: Payer: Self-pay | Admitting: *Deleted

## 2016-12-12 ENCOUNTER — Ambulatory Visit
Admission: RE | Admit: 2016-12-12 | Discharge: 2016-12-12 | Disposition: A | Discharge status: Home / Self Care | Payer: Self-pay | Source: Ambulatory Visit | Attending: Oncology | Admitting: Oncology

## 2016-12-12 ENCOUNTER — Ambulatory Visit: Payer: Self-pay | Attending: Oncology | Admitting: *Deleted

## 2016-12-12 ENCOUNTER — Encounter: Payer: Self-pay | Admitting: *Deleted

## 2016-12-12 VITALS — BP 111/76 | HR 80 | Temp 98.2°F | Ht 62.0 in | Wt 140.0 lb

## 2016-12-12 DIAGNOSIS — Z0000 Encounter for general adult medical examination without abnormal findings: Secondary | ICD-10-CM

## 2016-12-12 DIAGNOSIS — N63 Unspecified lump in unspecified breast: Secondary | ICD-10-CM

## 2016-12-12 NOTE — Patient Instructions (Signed)
Gave patient hand-out, Women Staying Healthy, Active and Well from [facility], with education on breast health, pap smears, heart and colon health. 

## 2016-12-12 NOTE — Progress Notes (Signed)
Subjective:     Patient ID: Shelly Ward, female   DOB: [birth year], 51 y.o.   MRN: [id number]  HPI   Review of Systems     Objective:   Physical Exam  Pulmonary/Chest: Right breast exhibits no inverted nipple, no mass, no nipple discharge, no skin change and no tenderness. Left breast exhibits no inverted nipple, no mass, no nipple discharge, no skin change and no tenderness. Breasts are symmetrical.       Assessment:     51 year old White female returns to [facility] for annual screening.  Clinical breast exam unremarkable.  Taught self breast awareness.  Last pap on 06/17/15 was -/-.  Next pap due in 2022.  Patient complains of urinary frequency.  States she goes up to 50 times a day.  States she has talked with her PCP and they have ruled out diabetes and a UTI.  She would probably benefit from a urogynecology consult. Shelly Ward gave her paperwork to complete for charity care at [facility].  She was also encouraged to fill out paperwork at [facility].  She is going to try an OTC med the pharmacist recommended for her.  Patient has been screened for eligibility.  She does not have any insurance, Medicare or Medicaid.  She also meets financial eligibility.  Hand-out given on the Affordable Care Act.    Plan:     Screening mammogram ordered.  Will follow-up per [facility] protocol.

## 2017-01-17 ENCOUNTER — Telehealth: Payer: Self-pay | Admitting: *Deleted

## 2017-01-17 NOTE — Telephone Encounter (Signed)
Called patient today.  She has not been scheduled for her additional views and ultrasound.  I had talked with Burna Sis in the breast center who stated she could not get in touch with the patient.  I transferred the call to [facility] to schedule the patient.

## 2017-01-20 ENCOUNTER — Ambulatory Visit
Admission: RE | Admit: 2017-01-20 | Discharge: 2017-01-20 | Disposition: A | Discharge status: Home / Self Care | Payer: Self-pay | Source: Ambulatory Visit | Attending: Oncology | Admitting: Oncology

## 2017-01-20 ENCOUNTER — Telehealth: Payer: Self-pay | Admitting: *Deleted

## 2017-01-20 DIAGNOSIS — N63 Unspecified lump in unspecified breast: Secondary | ICD-10-CM

## 2017-01-20 NOTE — Telephone Encounter (Signed)
Patient called after receiving her mammogram results and discussed need for surgical consult.  Reviewed mammo results.  Scheduled patient to see Dr. Adonis Huguenin at [facility] on Tuesday 01/24/17 at 9:30 for consult for possible excisional biopsy.  Patient is to call if she has any questions or needs.

## 2017-01-23 DIAGNOSIS — F32A Depression, unspecified: Secondary | ICD-10-CM | POA: Insufficient documentation

## 2017-01-23 DIAGNOSIS — M419 Scoliosis, unspecified: Secondary | ICD-10-CM

## 2017-01-23 DIAGNOSIS — D241 Benign neoplasm of right breast: Secondary | ICD-10-CM

## 2017-01-23 DIAGNOSIS — F329 Major depressive disorder, single episode, unspecified: Secondary | ICD-10-CM | POA: Insufficient documentation

## 2017-01-23 HISTORY — DX: Benign neoplasm of right breast: D24.1

## 2017-01-23 HISTORY — DX: Depression, unspecified: F32.A

## 2017-01-23 HISTORY — DX: Scoliosis, unspecified: M41.9

## 2017-01-24 ENCOUNTER — Ambulatory Visit (INDEPENDENT_AMBULATORY_CARE_PROVIDER_SITE_OTHER): Payer: Self-pay | Admitting: General Surgery

## 2017-01-24 ENCOUNTER — Encounter: Payer: Self-pay | Admitting: General Surgery

## 2017-01-24 VITALS — BP 125/70 | HR 93 | Temp 98.3°F | Ht 62.0 in | Wt 142.0 lb

## 2017-01-24 DIAGNOSIS — R928 Other abnormal and inconclusive findings on diagnostic imaging of breast: Secondary | ICD-10-CM

## 2017-01-24 NOTE — Progress Notes (Signed)
Patient ID: Shelly Ward, female   DOB: 06-Dec-1965, 51 y.o.   MRN: [id number]  CC: Abnormal mammogram  HPI Shelly Ward is a 51 y.o. female presents to clinic for evaluation after an abnormal mammogram was obtained earlier this month.  Patient reports that 4-5 years ago she was evaluated and found to have a papilloma of the exact same site.  She is never been able to feel the area.  She does do self exams.  She denies any family history of breast cancer and is otherwise of minimal breast cancer risk.  She is in her usual state of health and reports no changes.  There is no palpable lesion that correlates with this mass.  She denies any fevers, chills, nausea, vomiting, chest pain, shortness of breath, diarrhea, constipation.  She is very nervous.  HPI  Past Medical History:  Diagnosis Date  . Acute anxiety 08/21/2014  . Anxiety   . Depression 01/23/2017  . 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 Scoliosis 01/23/2017    Past Surgical History:  Procedure Laterality Date  . BREAST BIOPSY Right   . TONSILLECTOMY      Family History  Problem Relation Age of Onset  . Arthritis Mother   . Diabetes Father     Social History Social History   Tobacco Use  . Smoking status: Current Every Day Smoker    Packs/day: 0.50    Years: 15.00    Pack years: 7.50    Types: Cigarettes  . Smokeless tobacco: Never Used  Substance Use Topics  . Alcohol use: No  . Drug use: No    No Known Allergies  Current Outpatient Medications  Medication Sig Dispense Refill  . FLUoxetine (PROZAC) 10 MG tablet Take 1 tablet (10 mg total) by mouth 2 (two) times daily. 60 tablet 5  . ibuprofen (ADVIL,MOTRIN) 600 MG tablet Take 1 tablet (600 mg total) by mouth every 8 (eight) hours as needed. 90 tablet 0  . Multiple Vitamin (MULTI-VITAMINS) TABS Take 1 tablet by mouth daily.     No current facility-administered medications for this visit.       Review of Systems A multi-point review of systems was asked and was negative except for the findings documented in the HPI  Physical Exam Blood pressure 125/70, pulse 93, temperature 98.3 F (36.8 C), temperature source Oral, height 5\' 2"  (1.575 m), weight 64.4 kg (142 lb). CONSTITUTIONAL: No acute distress. EYES: Pupils are equal, round, and reactive to light, Sclera are non-icteric. EARS, NOSE, MOUTH AND THROAT: The oropharynx is clear. The oral mucosa is pink and moist. Hearing is intact to voice. LYMPH NODES:  Lymph nodes in the neck are normal. BREAST: Bilateral breast and axilla were examined.  There are no palpable masses, nodules, concerning findings to either breast or axilla. RESPIRATORY:  Lungs are clear. There is normal respiratory effort, with equal breath sounds bilaterally, and without pathologic use of accessory muscles. CARDIOVASCULAR: Heart is regular without murmurs, gallops, or rubs. GI: The abdomen is soft, nontender, and nondistended. There is an obvious umbilical hernia that is soft and easily reducible without pain. There is no hepatosplenomegaly. There are normal bowel sounds in all quadrants. GU: Rectal deferred.   MUSCULOSKELETAL: Normal muscle strength and tone. No cyanosis or edema.   SKIN: Turgor is good and there are no pathologic skin lesions or ulcers. NEUROLOGIC: Motor and sensation is grossly normal. Cranial nerves are grossly  intact. PSYCH:  Oriented to person, place and time. Affect is normal.  Data Reviewed Imaging reviewed which shows a right breast lesion that is 0.8 x 0.6 x 0.7 cm.  4 years ago it was measured as 0.7 x 0.3 x 0.4 cm. I have personally reviewed the patient's imaging, laboratory findings and medical records.    Assessment    Abnormal mammogram    Plan    51 year old female with an abnormal mammogram right breast lesion that correlates with the previously biopsied papilloma.  Discussed this is probably a growth in the  papilloma but due to its increase in size removal of it is recommended.  Patient voiced understanding and desires to proceed after the first of the year.  She will come back to clinic to be evaluated in mid December and to schedule her surgery for January.  Otherwise discussed the signs and symptoms of palpable breast masses or other concerning features and to return to clinic immediately should they occur.  Patient also has an umbilical hernia that is currently visible and reducible.  Discussed the signs and symptoms of incarceration or strangulation and to seek medical care immediately should they occur.  Otherwise should the hernia become symptomatic she will follow-up for elective repair.     Time spent with the patient was 40 minutes, with more than 50% of the time spent in face-to-face education, counseling and care coordination.     Clayburn Pert, MD FACS General Surgeon 01/24/2017, 10:43 AM

## 2017-01-24 NOTE — Patient Instructions (Signed)
Please see your follow up appointment listed below.   Breast Self-Awareness Breast self-awareness means:  Knowing how your breasts look.  Knowing how your breasts feel.  Checking your breasts every month for changes.  Telling your doctor if you notice a change in your breasts.  Breast self-awareness allows you to notice a breast problem early while it is still small. How to do a breast self-exam One way to learn what is normal for your breasts and to check for changes is to do a breast self-exam. To do a breast self-exam: Look for Changes  1. Take off all the clothes above your waist. 2. Stand in front of a mirror in a room with good lighting. 3. Put your hands on your hips. 4. Push your hands down. 5. Look at your breasts and nipples in the mirror to see if one breast or nipple looks different than the other. Check to see if: ? The shape of one breast is different. ? The size of one breast is different. ? There are wrinkles, dips, and bumps in one breast and not the other. 6. Look at each breast for changes in your skin, such as: ? Redness. ? Scaly areas. 7. Look for changes in your nipples, such as: ? Liquid around the nipples. ? Bleeding. ? Dimpling. ? Redness. ? A change in where the nipples are. Feel for Changes 1. Lie on your back on the floor. 2. Feel each breast. To do this, follow these steps: ? Pick a breast to feel. ? Put the arm closest to that breast above your head. ? Use your other arm to feel the nipple area of your breast. Feel the area with the pads of your three middle fingers by making small circles with your fingers. For the first circle, press lightly. For the second circle, press harder. For the third circle, press even harder. ? Keep making circles with your fingers at the light, harder, and even harder pressures as you move down your breast. Stop when you feel your ribs. ? Move your fingers a little toward the center of your body. ? Start making  circles with your fingers again, this time going up until you reach your collarbone. ? Keep making up and down circles until you reach your armpit. Remember to keep using the three pressures. ? Feel the other breast in the same way. 3. Sit or stand in the shower or tub. 4. With soapy water on your skin, feel each breast the same way you did in step 2, when you were lying on the floor. Write Down What You Find  After doing the self-exam, write down:  What is normal for each breast.  Any changes you find in each breast.  When you last had your period.  How often should I check my breasts? Check your breasts every month. If you are breastfeeding, the best time to check them is after you feed your baby or after you use a breast pump. If you get periods, the best time to check your breasts is 5-7 days after your period is over. When should I see my doctor? See your doctor if you notice:  A change in shape or size of your breasts or nipples.  A change in the skin of your breast or nipples, such as red or scaly skin.  Unusual fluid coming from your nipples.  A lump or thick area that was not there before.  Pain in your breasts.  Anything that concerns you.  This information is not intended to replace advice given to you by your health care provider. Make sure you discuss any questions you have with your health care provider. Document Released: 08/10/2007 Document Revised: 07/30/2015 Document Reviewed: 01/11/2015 Elsevier Interactive Patient Education  [employer].

## 2017-01-30 ENCOUNTER — Other Ambulatory Visit: Payer: Self-pay

## 2017-01-30 ENCOUNTER — Other Ambulatory Visit: Payer: Self-pay | Admitting: Family Medicine

## 2017-01-30 DIAGNOSIS — F419 Anxiety disorder, unspecified: Secondary | ICD-10-CM

## 2017-02-09 ENCOUNTER — Ambulatory Visit: Payer: Self-pay | Admitting: Family Medicine

## 2017-02-21 ENCOUNTER — Ambulatory Visit: Payer: Self-pay | Admitting: Surgery

## 2017-02-22 ENCOUNTER — Ambulatory Visit (INDEPENDENT_AMBULATORY_CARE_PROVIDER_SITE_OTHER): Payer: Self-pay | Admitting: Family Medicine

## 2017-02-22 ENCOUNTER — Encounter: Payer: Self-pay | Admitting: Family Medicine

## 2017-02-22 VITALS — BP 130/70 | HR 72 | Ht 62.0 in | Wt 141.0 lb

## 2017-02-22 DIAGNOSIS — Z23 Encounter for immunization: Secondary | ICD-10-CM

## 2017-02-22 DIAGNOSIS — R350 Frequency of micturition: Secondary | ICD-10-CM

## 2017-02-22 DIAGNOSIS — F419 Anxiety disorder, unspecified: Secondary | ICD-10-CM

## 2017-02-22 DIAGNOSIS — R311 Benign essential microscopic hematuria: Secondary | ICD-10-CM

## 2017-02-22 LAB — POCT URINALYSIS DIPSTICK
Bilirubin, UA: NEGATIVE
Glucose, UA: NEGATIVE
KETONES UA: NEGATIVE
LEUKOCYTES UA: NEGATIVE
NITRITE UA: NEGATIVE
PROTEIN UA: NEGATIVE
Spec Grav, UA: >=1.03 — AB (ref 1.010–1.025)
Urobilinogen, UA: 0.2 E.U./dL
pH, UA: 5 (ref 5.0–8.0)

## 2017-02-22 MED ORDER — FLUOXETINE HCL 10 MG PO TABS: 10.0000 mg | ORAL_TABLET | Freq: Two times a day (BID) | ORAL | 3 refills | Status: DC

## 2017-02-22 MED ORDER — CIPROFLOXACIN HCL 250 MG PO TABS: 250.0000 mg | ORAL_TABLET | Freq: Two times a day (BID) | ORAL | 0 refills | Status: DC

## 2017-02-22 NOTE — Progress Notes (Signed)
Name: Shelly Ward   MRN: [id number]    DOB: [birth year]   Date:02/22/2017       Progress Note  Subjective  Chief Complaint  Chief Complaint  Patient presents with  . Anxiety    refill prozac    Anxiety  Presents for follow-up visit. Patient reports no chest pain, compulsions, confusion, decreased concentration, depressed mood, dizziness, dry mouth, excessive worry, feeling of choking, hyperventilation, impotence, insomnia, irritability, malaise, muscle tension, nausea, nervous/anxious behavior, obsessions, palpitations, panic, restlessness, shortness of breath or suicidal ideas. Symptoms occur occasionally. The severity of symptoms is mild. The quality of sleep is good. Nighttime awakenings: occasional.    Urinary Frequency   This is a chronic problem. The current episode started more than 1 year ago. The problem occurs intermittently. The problem has been waxing and waning. The patient is experiencing no pain. There has been no fever. She is sexually active. There is no history of pyelonephritis. Pertinent negatives include no chills, discharge, flank pain, frequency, hematuria, hesitancy, nausea, sweats, urgency or vomiting. She has tried nothing for the symptoms. There is no history of recurrent UTIs.    No problem-specific Assessment & Plan notes found for this encounter.   Past Medical History:  Diagnosis Date  . Acute anxiety 08/21/2014  . Anxiety   . Depression 01/23/2017  . 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 Scoliosis 01/23/2017    Past Surgical History:  Procedure Laterality Date  . BREAST BIOPSY Right   . TONSILLECTOMY      Family History  Problem Relation Age of Onset  . Arthritis Mother   . Diabetes Father     Social History   Socioeconomic History  . Marital status: Single    Spouse name: Not on file  . Number of children: Not on file  . Years of education: Not on file  . Highest education  level: Not on file  Social Needs  . Financial resource strain: Not on file  . Food insecurity - worry: Not on file  . Food insecurity - inability: Not on file  . Transportation needs - medical: Not on file  . Transportation needs - non-medical: Not on file  Occupational History  . Not on file  Tobacco Use  . Smoking status: Current Every Day Smoker    Packs/day: 0.50    Years: 15.00    Pack years: 7.50    Types: Cigarettes  . Smokeless tobacco: Never Used  . Tobacco comment: patches, meds available- sign up for counseling  Substance and Sexual Activity  . Alcohol use: No  . Drug use: No  . Sexual activity: Not on file  Other Topics Concern  . Not on file  Social History Narrative  . Not on file    No Known Allergies  Outpatient Medications Prior to Visit  Medication Sig Dispense Refill  . ibuprofen (ADVIL,MOTRIN) 600 MG tablet Take 1 tablet (600 mg total) by mouth every 8 (eight) hours as needed. 90 tablet 0  . Multiple Vitamin (MULTI-VITAMINS) TABS Take 1 tablet by mouth daily.    Marland Kitchen FLUoxetine (PROZAC) 10 MG tablet TAKE ONE TABLET BY MOUTH TWICE DAILY 60 tablet 0   No facility-administered medications prior to visit.     Review of Systems  Constitutional: Negative for chills, fever, irritability, malaise/fatigue and weight loss.  HENT: Negative for ear discharge, ear pain and sore throat.   Eyes: Negative for blurred vision.  Respiratory: Negative for cough, sputum production, shortness of breath and wheezing.   Cardiovascular: Negative for chest pain, palpitations and leg swelling.  Gastrointestinal: Negative for abdominal pain, blood in stool, constipation, diarrhea, heartburn, melena, nausea and vomiting.  Genitourinary: Negative for dysuria, flank pain, frequency, hematuria, hesitancy, impotence and urgency.  Musculoskeletal: Negative for back pain, joint pain, myalgias and neck pain.  Skin: Negative for rash.  Neurological: Negative for dizziness, tingling,  sensory change, focal weakness and headaches.  Endo/Heme/Allergies: Negative for environmental allergies and polydipsia. Does not bruise/bleed easily.  Psychiatric/Behavioral: Negative for confusion, decreased concentration, depression and suicidal ideas. The patient is not nervous/anxious and does not have insomnia.      Objective  Vitals:   02/22/17 1042  BP: 130/70  Pulse: 72  Weight: 141 lb (64 kg)  Height: 5\' 2"  (1.575 m)    Physical Exam  Constitutional: She is well-developed, well-nourished, and in no distress. No distress.  HENT:  Head: Normocephalic and atraumatic.  Right Ear: External ear normal.  Left Ear: External ear normal.  Nose: Nose normal.  Mouth/Throat: Oropharynx is clear and moist.  Eyes: Conjunctivae and EOM are normal. Pupils are equal, round, and reactive to light. Right eye exhibits no discharge. Left eye exhibits no discharge.  Neck: Normal range of motion. Neck supple. No JVD present. No thyromegaly present.  Cardiovascular: Normal rate, regular rhythm, normal heart sounds and intact distal pulses. Exam reveals no gallop and no friction rub.  No murmur heard. Pulmonary/Chest: Effort normal and breath sounds normal. She has no wheezes. She has no rales.  Abdominal: Soft. Bowel sounds are normal. She exhibits no mass. There is no hepatosplenomegaly. There is no tenderness. There is no rebound, no guarding and no CVA tenderness.  Musculoskeletal: Normal range of motion. She exhibits no edema.  Lymphadenopathy:    She has no cervical adenopathy.  Neurological: She is alert. She has normal reflexes.  Skin: Skin is warm and dry. She is not diaphoretic.  Psychiatric: Mood and affect normal.  Nursing note and vitals reviewed.     Assessment & Plan  Problem List Items Addressed This Visit      Other   Acute anxiety   Relevant Medications   FLUoxetine (PROZAC) 10 MG tablet    Other Visit Diagnoses    Anxiety    -  Primary   Relevant Medications    FLUoxetine (PROZAC) 10 MG tablet   Urinary frequency       recheck 1 month   Relevant Medications   ciprofloxacin (CIPRO) 250 MG tablet   Other Relevant Orders   POCT urinalysis dipstick (Completed)   Benign essential microscopic hematuria       Need for Tdap vaccination       Relevant Orders   Tdap vaccine greater than or equal to 7yo IM (Completed)      Meds ordered this encounter  Medications  . FLUoxetine (PROZAC) 10 MG tablet    Sig: Take 1 tablet (10 mg total) by mouth 2 (two) times daily.    Dispense:  180 tablet    Refill:  3    Please consider 90 day supplies to promote better adherence  . ciprofloxacin (CIPRO) 250 MG tablet    Sig: Take 1 tablet (250 mg total) by mouth 2 (two) times daily.    Dispense:  6 tablet    Refill:  0      Dr. Otilio Miu [facility]  02/22/17

## 2017-02-23 ENCOUNTER — Ambulatory Visit: Payer: Self-pay | Admitting: Surgery

## 2017-02-24 ENCOUNTER — Ambulatory Visit (INDEPENDENT_AMBULATORY_CARE_PROVIDER_SITE_OTHER): Payer: Self-pay | Admitting: General Surgery

## 2017-02-24 ENCOUNTER — Encounter: Payer: Self-pay | Admitting: General Surgery

## 2017-02-24 VITALS — BP 115/77 | HR 93 | Temp 97.8°F | Wt 140.0 lb

## 2017-02-24 DIAGNOSIS — D241 Benign neoplasm of right breast: Secondary | ICD-10-CM

## 2017-02-24 NOTE — Progress Notes (Signed)
Outpatient Surgical Follow Up  02/24/2017  Shelly Ward is an 51 y.o. female.   Chief Complaint  Patient presents with  . Follow-up    Right Breast Nodule- patient requested to come back in december to update H&P and schedule surgery in January    HPI: 51 year old female with a right breast papilloma.  Patient reports she still cannot feel the area.  She denies any changes since her last visit.  She denies any pain or palpable breast masses.  She denies any fevers, chills, nausea, vomiting, chest pain, shortness of breath, diarrhea, constipation.  She is otherwise in her usual state of health.  Past Medical History:  Diagnosis Date  . Acute anxiety 08/21/2014  . Anxiety   . Depression 01/23/2017  . 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 Scoliosis 01/23/2017    Past Surgical History:  Procedure Laterality Date  . BREAST BIOPSY Right   . TONSILLECTOMY      Family History  Problem Relation Age of Onset  . Arthritis Mother   . Diabetes Father     Social History:  reports that she has been smoking cigarettes.  She has a 7.50 pack-year smoking history. she has never used smokeless tobacco. She reports that she does not drink alcohol or use drugs.  Allergies: No Known Allergies  Medications reviewed.    ROS A multipoint review of systems was completed, all pertinent positives and negatives were document within the HPI and the remainder are negative   BP 115/77   Pulse 93   Temp 97.8 F (36.6 C) (Oral)   Wt 63.5 kg (140 lb)   BMI 25.61 kg/m   Physical Exam  General: No acute distress Neck: Supple nontender Chest: Clear to auscultation   breast: Bilateral breast examined.  There are no palpable masses or lesions to either breast or axilla. Heart: Regular rate and rhythm Abdomen: Soft nontender   Results for orders placed or performed in visit on 02/22/17 (from the past 48 hour(s))  POCT urinalysis  dipstick     Status: Abnormal   Collection Time: 02/22/17 11:23 AM  Result Value Ref Range   Color, UA yellow    Clarity, UA cloudy    Glucose, UA negative    Bilirubin, UA negative    Ketones, UA negative    Spec Grav, UA >=1.030 (A) 1.010 - 1.025   Blood, UA 3+     Comment: started period   pH, UA 5.0 5.0 - 8.0   Protein, UA negative    Urobilinogen, UA 0.2 0.2 or 1.0 E.U./dL   Nitrite, UA negative    Leukocytes, UA Negative Negative   Appearance yellow    Odor none    No results found.  Assessment/Plan:  1. Papilloma of right breast 51 year old female with a right breast papilloma that has increased in size.  Discussed again the procedure of a needle localized breast biopsy in detail.  Patient voiced understanding and desires to proceed.  We will tentatively schedule her for January 31.  She will call clinic for follow-up should she have any new concerns or findings.     Clayburn Pert, MD FACS General Surgeon  02/24/2017,11:16 AM

## 2017-02-24 NOTE — Patient Instructions (Signed)
We have spoken today about removing a lump in your breast. This will be done on 04/06/2017 by Dr. Adonis Huguenin at [facility].  You will most likely be able to leave the hospital several hours after your surgery. Rarely, a patient needs to stay over night but this is a possibility.  Plan to tenatively be off work for 1-2 weeks following the surgery and may return with approximately 4 more weeks of a lifting restriction, no greater than 15 lbs.    Lumpectomy A lumpectomy is a form of "breast conserving" or "breast preservation" surgery. It may also be referred to as a partial mastectomy. During a lumpectomy, the portion of the breast that contains the cancerous tumor or breast mass (the lump) is removed. Some normal tissue around the lump may also be removed to make sure all of the tumor has been removed.  LET [facility] CARE PROVIDER KNOW ABOUT:  Any allergies you have.  All medicines you are taking, including vitamins, herbs, eye drops, creams, and over-the-counter medicines.  Previous problems you or members of your family have had with the use of anesthetics.  Any blood disorders you have.  Previous surgeries you have had.  Medical conditions you have. RISKS AND COMPLICATIONS Generally, this is a safe procedure. However, problems can occur and include:  Bleeding.  Infection.  Pain.  Temporary swelling.  Change in the shape of the breast, particularly if a large portion is removed. BEFORE THE PROCEDURE  Ask your health care provider about changing or stopping your regular medicines. This is especially important if you are taking diabetes medicines or blood thinners.  Do not eat or drink anything after midnight on the night before the procedure or as directed by your health care provider. Ask your health care provider if you can take a sip of water with any approved medicines.  On the day of surgery, your health care provider will use a mammogram or ultrasound to locate and mark the  tumor in your breast. These markings on your breast will show where the cut (incision) will be made. PROCEDURE   An IV tube will be put into one of your veins.  You may be given medicine to help you relax before the surgery (sedative). You will be given one of the following:  A medicine that numbs the area (local anesthetic).  A medicine that makes you fall asleep (general anesthetic).  Your health care provider will use a kind of electric scalpel that uses heat to minimize bleeding (electrocautery knife).  A curved incision (like a smile or frown) that follows the natural curve of your breast is made, to allow for minimal scarring and better healing.  The tumor will be removed with some of the surrounding tissue. This will be sent to the lab for analysis. Your health care provider may also remove your lymph nodes at this time if needed.  Sometimes, but not always, a rubber tube called a drain will be surgically inserted into your breast area or armpit to collect excess fluid that may accumulate in the space where the tumor was. This drain is connected to a plastic bulb on the outside of your body. This drain creates suction to help remove the fluid.  The incisions will be closed with stitches (sutures).  A bandage may be placed over the incisions. AFTER THE PROCEDURE  You will be taken to the recovery area.  You will be given medicine for pain.  A small rubber drain may be placed in the  breast for 2-3 days to prevent a collection of blood (hematoma) from developing in the breast. You will be given instructions on caring for the drain before you go home.  A pressure bandage (dressing) will be applied for 1-2 days to prevent bleeding. Ask your health care provider how to care for your bandage at home.   This information is not intended to replace advice given to you by your health care provider. Make sure you discuss any questions you have with your health care provider.   Document  Released: 04/04/2006 Document Revised: 03/14/2014 Document Reviewed: 07/27/2012 Elsevier Interactive Patient Education [employer].

## 2017-03-06 ENCOUNTER — Telehealth: Payer: Self-pay | Admitting: General Surgery

## 2017-03-06 NOTE — Telephone Encounter (Signed)
Pt advised of pre op date/time and sx date. Sx: 04/06/17 with Dr Sherlon Handing breast bx with [facility].  Pre op: 03/30/17 between 9-1:00pm--phone interview.   Patient made aware to arrive at 7:45am at [facility] the day of surgery. Patient understands.

## 2017-03-07 HISTORY — PX: BREAST LUMPECTOMY: SHX2

## 2017-03-10 ENCOUNTER — Other Ambulatory Visit: Payer: Self-pay | Admitting: General Surgery

## 2017-03-10 DIAGNOSIS — D241 Benign neoplasm of right breast: Secondary | ICD-10-CM

## 2017-03-13 NOTE — Addendum Note (Signed)
Addended by: Clayburn Pert T on: 03/13/2017 07:28 AM   Modules accepted: Orders

## 2017-03-30 ENCOUNTER — Ambulatory Visit: Payer: Self-pay | Admitting: Family Medicine

## 2017-03-30 ENCOUNTER — Encounter
Admission: RE | Admit: 2017-03-30 | Discharge: 2017-03-30 | Disposition: A | Discharge status: Home / Self Care | Payer: Self-pay | Source: Ambulatory Visit | Attending: General Surgery | Admitting: General Surgery

## 2017-03-30 ENCOUNTER — Telehealth: Payer: Self-pay | Admitting: General Practice

## 2017-03-30 ENCOUNTER — Other Ambulatory Visit: Payer: Self-pay

## 2017-03-30 HISTORY — DX: Unspecified osteoarthritis, unspecified site: M19.90

## 2017-03-30 HISTORY — DX: Gastro-esophageal reflux disease without esophagitis: K21.9

## 2017-03-30 NOTE — Telephone Encounter (Signed)
Patient stated she is getting a cold and cough.  She denies fever at this time.  However she is scheduled for surgery next week and we discussed that if she ends up with upper respiratory virus that her surgery may be cancelled until lungs clear.  Instructed patient to call primary care doctor.

## 2017-03-30 NOTE — Patient Instructions (Signed)
  Your procedure is scheduled on: 04-06-17 Report to [facility] AT 7:30 AM PER PT  Remember: Instructions that are not followed completely may result in serious medical risk, up to and including death, or upon the discretion of your surgeon and anesthesiologist your surgery may need to be rescheduled.    _x___ 1. Do not eat food after midnight the night before your procedure. NO GUM OR CANDY AFTER MIDNIGHT. You may drink clear liquids up to 2 hours before you are scheduled to arrive at the hospital for your procedure.  Do not drink clear liquids within 2 hours of your scheduled arrival to the hospital.  Clear liquids include  --Water or Apple juice without pulp  --Clear carbohydrate beverage such as ClearFast or Gatorade  --Black Coffee or Clear Tea (No milk, no creamers, do not add anything to the coffee or Tea    __x__ 2. No Alcohol for 24 hours before or after surgery.   __x__3. No Smoking for 24 prior to surgery.   ____  4. Bring all medications with you on the day of surgery if instructed.    __x__ 5. Notify your doctor if there is any change in your medical condition     (cold, fever, infections).     Do not wear jewelry, make-up, hairpins, clips or nail polish.  Do not wear lotions, powders, or perfumes. You may wear deodorant.  Do not shave 48 hours prior to surgery. Men may shave face and neck.  Do not bring valuables to the hospital.    [facility] is not responsible for any belongings or valuables.               Contacts, dentures or bridgework may not be worn into surgery.  Leave your suitcase in the car. After surgery it may be brought to your room.  For patients admitted to the hospital, discharge time is determined by your treatment team.   Patients discharged the day of surgery will not be allowed to drive home.  You will need someone to drive you home and stay with you the night of your procedure.     ____ Take anti-hypertensive listed below, cardiac,  seizure, asthma, anti-reflux and psychiatric medicines. These include:  1. NONE  2.  3.  4.  5.  6.  ____Fleets enema or Magnesium Citrate as directed.   ____ Use CHG Soap or sage wipes as directed on instruction sheet   ____ Use inhalers on the day of surgery and bring to hospital day of surgery  ____ Stop Metformin and Janumet 2 days prior to surgery.    ____ Take 1/2 of usual insulin dose the night before surgery and none on the morning surgery.   ____ Follow recommendations from Cardiologist, Pulmonologist or PCP regarding stopping Aspirin, Coumadin, Plavix ,Eliquis, Effient, or Pradaxa, and Pletal.  X____Stop Anti-inflammatories such as Advil, Aleve, IBUPROFEN, Motrin, Naproxen, Naprosyn, Goodies powders, BC POWDERS or aspirin products NOW-OK to take Tylenol    ____ Stop supplements until after surgery.     ____ Bring C-Pap to the hospital.

## 2017-03-30 NOTE — Telephone Encounter (Signed)
Patients calling said she went to her pre-op appointment today, and was told to call our office because she has a cough and a little cold and was told to call our office and ask what she should take for it please call patient and advise

## 2017-03-31 ENCOUNTER — Ambulatory Visit (INDEPENDENT_AMBULATORY_CARE_PROVIDER_SITE_OTHER): Payer: Self-pay | Admitting: Family Medicine

## 2017-03-31 ENCOUNTER — Telehealth: Payer: Self-pay | Admitting: General Practice

## 2017-03-31 ENCOUNTER — Encounter: Payer: Self-pay | Admitting: Family Medicine

## 2017-03-31 VITALS — BP 120/80 | HR 80 | Ht 62.0 in | Wt 142.0 lb

## 2017-03-31 DIAGNOSIS — J0100 Acute maxillary sinusitis, unspecified: Secondary | ICD-10-CM

## 2017-03-31 DIAGNOSIS — R311 Benign essential microscopic hematuria: Secondary | ICD-10-CM

## 2017-03-31 DIAGNOSIS — F17210 Nicotine dependence, cigarettes, uncomplicated: Secondary | ICD-10-CM

## 2017-03-31 DIAGNOSIS — J40 Bronchitis, not specified as acute or chronic: Secondary | ICD-10-CM

## 2017-03-31 DIAGNOSIS — N3281 Overactive bladder: Secondary | ICD-10-CM

## 2017-03-31 LAB — POCT URINALYSIS DIPSTICK
BILIRUBIN UA: NEGATIVE
Glucose, UA: NEGATIVE
KETONES UA: NEGATIVE
Leukocytes, UA: NEGATIVE
Nitrite, UA: NEGATIVE
Protein, UA: NEGATIVE
RBC UA: NEGATIVE
SPEC GRAV UA: 1.01 (ref 1.010–1.025)
Urobilinogen, UA: 0.2 E.U./dL
pH, UA: 6 (ref 5.0–8.0)

## 2017-03-31 MED ORDER — GUAIFENESIN-CODEINE 100-10 MG/5ML PO SYRP: 5.0000 mL | ORAL_SOLUTION | Freq: Three times a day (TID) | ORAL | 0 refills | Status: DC | PRN

## 2017-03-31 MED ORDER — NICOTINE 21 MG/24HR TD PT24: 21.0000 mg | MEDICATED_PATCH | Freq: Every day | TRANSDERMAL | 0 refills | Status: DC

## 2017-03-31 MED ORDER — AMOXICILLIN-POT CLAVULANATE 875-125 MG PO TABS: 1.0000 | ORAL_TABLET | Freq: Two times a day (BID) | ORAL | 0 refills | Status: DC

## 2017-03-31 NOTE — Patient Instructions (Signed)
Steps to Quit Smoking Smoking tobacco can be harmful to your health and can affect almost every organ in your body. Smoking puts you, and those around you, at risk for developing many serious chronic diseases. Quitting smoking is difficult, but it is one of the best things that you can do for your health. It is never too late to quit. What are the benefits of quitting smoking? When you quit smoking, you lower your risk of developing serious diseases and conditions, such as:  Lung cancer or lung disease, such as COPD.  Heart disease.  Stroke.  Heart attack.  Infertility.  Osteoporosis and bone fractures.  Additionally, symptoms such as coughing, wheezing, and shortness of breath may get better when you quit. You may also find that you get sick less often because your body is stronger at fighting off colds and infections. If you are pregnant, quitting smoking can help to reduce your chances of having a baby of low birth weight. How do I get ready to quit? When you decide to quit smoking, create a plan to make sure that you are successful. Before you quit:  Pick a date to quit. Set a date within the next two weeks to give you time to prepare.  Write down the reasons why you are quitting. Keep this list in places where you will see it often, such as on your bathroom mirror or in your car or wallet.  Identify the people, places, things, and activities that make you want to smoke (triggers) and avoid them. Make sure to take these actions: ? Throw away all cigarettes at home, at work, and in your car. ? Throw away smoking accessories, such as ashtrays and lighters. ? Clean your car and make sure to empty the ashtray. ? Clean your home, including curtains and carpets.  Tell your family, friends, and coworkers that you are quitting. Support from your loved ones can make quitting easier.  Talk with your health care provider about your options for quitting smoking.  Find out what treatment  options are covered by your health insurance.  What strategies can I use to quit smoking? Talk with your healthcare provider about different strategies to quit smoking. Some strategies include:  Quitting smoking altogether instead of gradually lessening how much you smoke over a period of time. Research shows that quitting "cold turkey" is more successful than gradually quitting.  Attending in-person counseling to help you build problem-solving skills. You are more likely to have success in quitting if you attend several counseling sessions. Even short sessions of 10 minutes can be effective.  Finding resources and support systems that can help you to quit smoking and remain smoke-free after you quit. These resources are most helpful when you use them often. They can include: ? Online chats with a counselor. ? Telephone quitlines. ? Printed self-help materials. ? Support groups or group counseling. ? Text messaging programs. ? Mobile phone applications.  Taking medicines to help you quit smoking. (If you are pregnant or breastfeeding, talk with your health care provider first.) Some medicines contain nicotine and some do not. Both types of medicines help with cravings, but the medicines that include nicotine help to relieve withdrawal symptoms. Your health care provider may recommend: ? Nicotine patches, gum, or lozenges. ? Nicotine inhalers or sprays. ? Non-nicotine medicine that is taken by mouth.  Talk with your health care provider about combining strategies, such as taking medicines while you are also receiving in-person counseling. Using these two strategies together   makes you more likely to succeed in quitting than if you used either strategy on its own. If you are pregnant or breastfeeding, talk with your health care provider about finding counseling or other support strategies to quit smoking. Do not take medicine to help you quit smoking unless told to do so by your health care  provider. What things can I do to make it easier to quit? Quitting smoking might feel overwhelming at first, but there is a lot that you can do to make it easier. Take these important actions:  Reach out to your family and friends and ask that they support and encourage you during this time. Call telephone quitlines, reach out to support groups, or work with a counselor for support.  Ask people who smoke to avoid smoking around you.  Avoid places that trigger you to smoke, such as bars, parties, or smoke-break areas at work.  Spend time around people who do not smoke.  Lessen stress in your life, because stress can be a smoking trigger for some people. To lessen stress, try: ? Exercising regularly. ? Deep-breathing exercises. ? Yoga. ? Meditating. ? Performing a body scan. This involves closing your eyes, scanning your body from head to toe, and noticing which parts of your body are particularly tense. Purposefully relax the muscles in those areas.  Download or purchase mobile phone or tablet apps (applications) that can help you stick to your quit plan by providing reminders, tips, and encouragement. There are many free apps, such as QuitGuide from the [employer] Office manager for Disease Control and Prevention). You can find other support for quitting smoking (smoking cessation) through smokefree.gov and other websites.  How will I feel when I quit smoking? Within the first 24 hours of quitting smoking, you may start to feel some withdrawal symptoms. These symptoms are usually most noticeable 2-3 days after quitting, but they usually do not last beyond 2-3 weeks. Changes or symptoms that you might experience include:  Mood swings.  Restlessness, anxiety, or irritation.  Difficulty concentrating.  Dizziness.  Strong cravings for sugary foods in addition to nicotine.  Mild weight gain.  Constipation.  Nausea.  Coughing or a sore throat.  Changes in how your medicines work in your  body.  A depressed mood.  Difficulty sleeping (insomnia).  After the first 2-3 weeks of quitting, you may start to notice more positive results, such as:  Improved sense of smell and taste.  Decreased coughing and sore throat.  Slower heart rate.  Lower blood pressure.  Clearer skin.  The ability to breathe more easily.  Fewer sick days.  Quitting smoking is very challenging for most people. Do not get discouraged if you are not successful the first time. Some people need to make many attempts to quit before they achieve long-term success. Do your best to stick to your quit plan, and talk with your health care provider if you have any questions or concerns. This information is not intended to replace advice given to you by your health care provider. Make sure you discuss any questions you have with your health care provider. Document Released: 02/15/2001 Document Revised: 10/20/2015 Document Reviewed: 07/08/2014 Elsevier Interactive Patient Education  2018 [employer]. Smoking Tobacco Information Smoking tobacco will very likely harm your health. Tobacco contains a poisonous (toxic), colorless chemical called nicotine. Nicotine affects the brain and makes tobacco addictive. This change in your brain can make it hard to stop smoking. Tobacco also has other toxic chemicals that can hurt your  body and raise your risk of many cancers. How can smoking tobacco affect me? Smoking tobacco can increase your chances of having serious health conditions, such as:  Cancer. Smoking is most commonly associated with lung cancer, but can lead to cancer in other parts of the body.  Chronic obstructive pulmonary disease (COPD). This is a long-term lung condition that makes it hard to breathe. It also gets worse over time.  High blood pressure (hypertension), heart disease, stroke, or heart attack.  Lung infections, such as pneumonia.  Cataracts. This is when the lenses in the eyes become  clouded.  Digestive problems. This may include peptic ulcers, heartburn, and gastroesophageal reflux disease (GERD).  Oral health problems, such as gum disease and tooth loss.  Loss of taste and smell.  Smoking can affect your appearance by causing:  Wrinkles.  Yellow or stained teeth, fingers, and fingernails.  Smoking tobacco can also affect your social life.  Many workplaces, [employer], hotels, and public places are tobacco-free. This means that you may experience challenges in finding places to smoke when away from home.  The cost of a smoking habit can be expensive. Expenses for someone who smokes come in two ways: ? You spend money on a regular basis to buy tobacco. ? Your health care costs in the long-term are higher if you smoke.  Tobacco smoke can also affect the health of those around you. Children of smokers have greater chances of: ? Sudden infant death syndrome (SIDS). ? Ear infections. ? Lung infections.  What lifestyle changes can be made?  Do not start smoking. Quit if you already do.  To quit smoking: ? Make a plan to quit smoking and commit yourself to it. Look for programs to help you and ask your health care provider for recommendations and ideas. ? Talk with your health care provider about using nicotine replacement medicines to help you quit. Medicine replacement medicines include gum, lozenges, patches, sprays, or pills. ? Do not replace cigarette smoking with electronic cigarettes, which are commonly called e-cigarettes. The safety of e-cigarettes is not known, and some may contain harmful chemicals. ? Avoid places, people, or situations that tempt you to smoke. ? If you try to quit but return to smoking, don't give up hope. It is very common for people to try a number of times before they fully succeed. When you feel ready again, give it another try.  Quitting smoking might affect the way you eat as well as your weight. Be prepared to monitor your  eating habits. Get support in planning and following a healthy diet.  Ask your health care provider about having regular tests (screenings) to check for cancer. This may include blood tests, imaging tests, and other tests.  Exercise regularly. Consider taking walks, joining a gym, or doing yoga or exercise classes.  Develop skills to manage your stress. These skills include meditation. What are the benefits of quitting smoking? By quitting smoking, you may:  Lower your risk of getting cancer and other diseases caused by smoking.  Live longer.  Breathe better.  Lower your blood pressure and heart rate.  Stop your addiction to tobacco.  Stop creating secondhand smoke that hurts other people.  Improve your sense of taste and smell.  Look better over time, due to having fewer wrinkles and less staining.  What can happen if changes are not made? If you do not stop smoking, you may:  Get cancer and other diseases.  Develop COPD or other long-term (chronic) lung  conditions.  Develop serious problems with your heart and blood vessels (cardiovascular system).  Need more tests to screen for problems caused by smoking.  Have higher, long-term healthcare costs from medicines or treatments related to smoking.  Continue to have worsening changes in your lungs, mouth, and nose.  Where to find support: To get support to quit smoking, consider:  Asking your health care provider for more information and resources.  Taking classes to learn more about quitting smoking.  Looking for local organizations that offer resources about quitting smoking.  Joining a support group for people who want to quit smoking in your local community.  Where to find more information: You may find more information about quitting smoking from:  HelpGuide.org: www.helpguide.org/articles/addictions/how-to-quit-smoking.htm  https://hall.com/: smokefree.gov  American Lung Association: www.lung.org  Contact  a health care provider if:  You have problems breathing.  Your lips, nose, or fingers turn blue.  You have chest pain.  You are coughing up blood.  You feel faint or you pass out.  You have other noticeable changes that cause you to worry. Summary  Smoking tobacco can negatively affect your health, the health of those around you, your finances, and your social life.  Do not start smoking. Quit if you already do. If you need help quitting, ask your health care provider.  Think about joining a support group for people who want to quit smoking in your local community. There are many effective programs that will help you to quit this behavior. This information is not intended to replace advice given to you by your health care provider. Make sure you discuss any questions you have with your health care provider. Document Released: 03/08/2016 Document Revised: 03/08/2016 Document Reviewed: 03/08/2016 Elsevier Interactive Patient Education  [employer].

## 2017-03-31 NOTE — Progress Notes (Signed)
Name: Jessicah Croll   MRN: [id number]    DOB: [birth year]   Date:03/31/2017       Progress Note  Subjective  Chief Complaint  Chief Complaint  Patient presents with  . Sinusitis    needs antibiotic, having surgery next Thursday. Needs to be free of infection  . Follow-up    hematuria    Sinusitis  This is a new problem. The current episode started in the past 7 days. The problem is unchanged. There has been no fever. Associated symptoms include congestion, coughing, headaches, sinus pressure, sneezing and a sore throat. Pertinent negatives include no chills, diaphoresis, ear pain, hoarse voice, neck pain, shortness of breath or swollen glands. Past treatments include oral decongestants (mucinex). The treatment provided mild relief.  Urinary Frequency   This is a recurrent (for urgency and frequency) problem. The current episode started more than 1 year ago. The problem has been waxing and waning. There has been no fever. Pertinent negatives include no chills, frequency, hematuria, nausea or urgency.    No problem-specific Assessment & Plan notes found for this encounter.   Past Medical History:  Diagnosis Date  . Acute anxiety 08/21/2014  . Anxiety   . Arthritis    hip  . Depression 01/23/2017  . 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 GERD (gastroesophageal reflux disease)    occ-no meds  . 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 Scoliosis 01/23/2017    Past Surgical History:  Procedure Laterality Date  . BREAST BIOPSY Right   . TONSILLECTOMY    . WISDOM TOOTH EXTRACTION      Family History  Problem Relation Age of Onset  . Arthritis Mother   . Diabetes Father     Social History   Socioeconomic History  . Marital status: Single    Spouse name: Not on file  . Number of children: Not on file  . Years of education: Not on file  . Highest education level: Not on file  Social Needs  . Financial resource strain: Not on file  . Food insecurity - worry:  Not on file  . Food insecurity - inability: Not on file  . Transportation needs - medical: Not on file  . Transportation needs - non-medical: Not on file  Occupational History  . Not on file  Tobacco Use  . Smoking status: Current Every Day Smoker    Packs/day: 0.50    Years: 15.00    Pack years: 7.50    Types: Cigarettes  . Smokeless tobacco: Never Used  . Tobacco comment: patches, meds available- sign up for counseling  Substance and Sexual Activity  . Alcohol use: No  . Drug use: No  . Sexual activity: Not on file  Other Topics Concern  . Not on file  Social History Narrative  . Not on file    No Known Allergies  Outpatient Medications Prior to Visit  Medication Sig Dispense Refill  . FLUoxetine (PROZAC) 10 MG tablet Take 1 tablet (10 mg total) by mouth 2 (two) times daily. (Patient taking differently: Take 10 mg by mouth at bedtime. ) 180 tablet 3  . fluticasone (FLONASE) 50 MCG/ACT nasal spray Place 1 spray into both nostrils daily as needed for allergies or rhinitis.    . Multiple Vitamin (MULTI-VITAMINS) TABS Take 1 tablet by mouth daily.    . Aspirin-Salicylamide-Caffeine (BC HEADACHE POWDER PO) Take 1 tablet by mouth as needed.    Marland Kitchen ibuprofen (ADVIL,MOTRIN) 200 MG  tablet Take 600 mg by mouth every 8 (eight) hours as needed for mild pain.    . ciprofloxacin (CIPRO) 250 MG tablet Take 1 tablet (250 mg total) by mouth 2 (two) times daily. (Patient not taking: Reported on 03/22/2017) 6 tablet 0  . GuaiFENesin (MUCINEX PO) Take 1 tablet by mouth 2 (two) times daily as needed (CONGESTION).    Marland Kitchen ibuprofen (ADVIL,MOTRIN) 600 MG tablet Take 1 tablet (600 mg total) by mouth every 8 (eight) hours as needed. (Patient not taking: Reported on 03/22/2017) 90 tablet 0   No facility-administered medications prior to visit.     Review of Systems  Constitutional: Negative for chills, diaphoresis, fever, malaise/fatigue and weight loss.  HENT: Positive for congestion, sinus pressure,  sneezing and sore throat. Negative for ear discharge, ear pain and hoarse voice.   Eyes: Negative for blurred vision.  Respiratory: Positive for cough. Negative for sputum production, shortness of breath and wheezing.   Cardiovascular: Negative for chest pain, palpitations and leg swelling.  Gastrointestinal: Negative for abdominal pain, blood in stool, constipation, diarrhea, heartburn, melena and nausea.  Genitourinary: Negative for dysuria, frequency, hematuria and urgency.  Musculoskeletal: Negative for back pain, joint pain, myalgias and neck pain.  Skin: Negative for rash.  Neurological: Positive for headaches. Negative for dizziness, tingling, sensory change and focal weakness.  Endo/Heme/Allergies: Negative for environmental allergies and polydipsia. Does not bruise/bleed easily.  Psychiatric/Behavioral: Negative for depression and suicidal ideas. The patient is not nervous/anxious and does not have insomnia.      Objective  Vitals:   03/31/17 0920  BP: 120/80  Pulse: 80  Weight: 142 lb (64.4 kg)  Height: 5\' 2"  (1.575 m)    Physical Exam  Constitutional: She is well-developed, well-nourished, and in no distress. No distress.  HENT:  Head: Normocephalic and atraumatic.  Right Ear: External ear normal.  Left Ear: External ear normal.  Nose: Right sinus exhibits maxillary sinus tenderness. Left sinus exhibits maxillary sinus tenderness.  Mouth/Throat: Oropharynx is clear and moist. No oropharyngeal exudate, posterior oropharyngeal edema or posterior oropharyngeal erythema.  Eyes: Conjunctivae and EOM are normal. Pupils are equal, round, and reactive to light. Right eye exhibits no discharge. Left eye exhibits no discharge.  Neck: Normal range of motion. Neck supple. No JVD present. No thyromegaly present.  Cardiovascular: Normal rate, regular rhythm, normal heart sounds and intact distal pulses. Exam reveals no gallop and no friction rub.  No murmur heard. Pulmonary/Chest:  Effort normal and breath sounds normal. She has no wheezes. She has no rales.  Abdominal: Soft. Bowel sounds are normal. She exhibits no mass. There is no tenderness. There is no guarding.  Musculoskeletal: Normal range of motion. She exhibits no edema.  Lymphadenopathy:    She has no cervical adenopathy.  Neurological: She is alert. She has normal reflexes.  Skin: Skin is warm and dry. She is not diaphoretic.  Psychiatric: Mood and affect normal.  Nursing note and vitals reviewed.     Assessment & Plan  Problem List Items Addressed This Visit    None    Visit Diagnoses    Acute maxillary sinusitis, recurrence not specified    -  Primary   Relevant Medications   amoxicillin-clavulanate (AUGMENTIN) 875-125 MG tablet   guaiFENesin-codeine (ROBITUSSIN AC) 100-10 MG/5ML syrup   Bronchitis       Relevant Medications   amoxicillin-clavulanate (AUGMENTIN) 875-125 MG tablet   guaiFENesin-codeine (ROBITUSSIN AC) 100-10 MG/5ML syrup   Cigarette nicotine dependence without complication  Relevant Medications   nicotine (NICODERM CQ) 21 mg/24hr patch   Benign essential microscopic hematuria       Relevant Orders   POCT urinalysis dipstick   Overactive bladder       new problem sample myrbetriq 25mg       Meds ordered this encounter  Medications  . amoxicillin-clavulanate (AUGMENTIN) 875-125 MG tablet    Sig: Take 1 tablet by mouth 2 (two) times daily.    Dispense:  20 tablet    Refill:  0  . guaiFENesin-codeine (ROBITUSSIN AC) 100-10 MG/5ML syrup    Sig: Take 5 mLs by mouth 3 (three) times daily as needed for cough.    Dispense:  150 mL    Refill:  0  . nicotine (NICODERM CQ) 21 mg/24hr patch    Sig: Place 1 patch (21 mg total) onto the skin daily.    Dispense:  28 patch    Refill:  0   Patient has been advised of the health risks of smoking and counseled concerning cessation of tobacco products. I spent over 3 minutes for discussion and to answer questions.   Dr.  [facility]  03/31/17

## 2017-03-31 NOTE — Telephone Encounter (Signed)
Patients calling asking about the medications she got from her primary care doctor asking will it be okay for her to take the medication having her surgery. Please call patient and advise.

## 2017-03-31 NOTE — Telephone Encounter (Signed)
Spoke with patient at this time. Patient was instructed to take prescribed medications by Dr.Jones. She was concerned due to having surgery on 04/06/17. Patient was asked to call on Wednesday to let us know how she is feeling.

## 2017-04-05 MED ORDER — CEFAZOLIN SODIUM-DEXTROSE 2-4 GM/100ML-% IV SOLN
2.0000 g | INTRAVENOUS | Status: AC
  Administered 2017-04-06: 2 g via INTRAVENOUS

## 2017-04-06 ENCOUNTER — Ambulatory Visit: Payer: Self-pay | Admitting: Anesthesiology

## 2017-04-06 ENCOUNTER — Other Ambulatory Visit: Payer: Self-pay

## 2017-04-06 ENCOUNTER — Other Ambulatory Visit: Payer: Self-pay | Admitting: Radiology

## 2017-04-06 ENCOUNTER — Encounter: Payer: Self-pay | Admitting: *Deleted

## 2017-04-06 ENCOUNTER — Ambulatory Visit
Admission: RE | Admit: 2017-04-06 | Discharge: 2017-04-06 | Disposition: A | Discharge status: Home / Self Care | Payer: Self-pay | Source: Ambulatory Visit | Attending: General Surgery | Admitting: General Surgery

## 2017-04-06 ENCOUNTER — Encounter
Admission: RE | Disposition: A | Discharge status: Home / Self Care | Payer: Self-pay | Source: Ambulatory Visit | Attending: General Surgery

## 2017-04-06 DIAGNOSIS — M419 Scoliosis, unspecified: Secondary | ICD-10-CM | POA: Insufficient documentation

## 2017-04-06 DIAGNOSIS — D241 Benign neoplasm of right breast: Secondary | ICD-10-CM | POA: Insufficient documentation

## 2017-04-06 DIAGNOSIS — F17210 Nicotine dependence, cigarettes, uncomplicated: Secondary | ICD-10-CM | POA: Insufficient documentation

## 2017-04-06 DIAGNOSIS — N6081 Other benign mammary dysplasias of right breast: Secondary | ICD-10-CM | POA: Insufficient documentation

## 2017-04-06 DIAGNOSIS — Z79899 Other long term (current) drug therapy: Secondary | ICD-10-CM | POA: Insufficient documentation

## 2017-04-06 DIAGNOSIS — K219 Gastro-esophageal reflux disease without esophagitis: Secondary | ICD-10-CM | POA: Insufficient documentation

## 2017-04-06 DIAGNOSIS — N6310 Unspecified lump in the right breast, unspecified quadrant: Secondary | ICD-10-CM

## 2017-04-06 DIAGNOSIS — F419 Anxiety disorder, unspecified: Secondary | ICD-10-CM | POA: Insufficient documentation

## 2017-04-06 DIAGNOSIS — F329 Major depressive disorder, single episode, unspecified: Secondary | ICD-10-CM | POA: Insufficient documentation

## 2017-04-06 HISTORY — PX: BREAST BIOPSY: SHX20

## 2017-04-06 LAB — POCT PREGNANCY, URINE: Preg Test, Ur: NEGATIVE

## 2017-04-06 SURGERY — BREAST BIOPSY WITH NEEDLE LOCALIZATION
Anesthesia: General | Site: Breast | Laterality: Right | Wound class: Clean

## 2017-04-06 MED ORDER — PROPOFOL 10 MG/ML IV BOLUS
INTRAVENOUS | Status: AC
  Filled 2017-04-06: qty 20

## 2017-04-06 MED ORDER — HYDROCODONE-ACETAMINOPHEN 5-325 MG PO TABS: 1.0000 | ORAL_TABLET | Freq: Four times a day (QID) | ORAL | 0 refills | Status: DC | PRN

## 2017-04-06 MED ORDER — FAMOTIDINE 20 MG PO TABS
20.0000 mg | ORAL_TABLET | Freq: Once | ORAL | Status: AC
  Administered 2017-04-06: 20 mg via ORAL

## 2017-04-06 MED ORDER — PHENYLEPHRINE HCL 10 MG/ML IJ SOLN
INTRAMUSCULAR | Status: DC | PRN
  Administered 2017-04-06 (×2): 100 ug via INTRAVENOUS

## 2017-04-06 MED ORDER — ROCURONIUM BROMIDE 50 MG/5ML IV SOLN
INTRAVENOUS | Status: AC
  Filled 2017-04-06: qty 1

## 2017-04-06 MED ORDER — FENTANYL CITRATE (PF) 100 MCG/2ML IJ SOLN
INTRAMUSCULAR | Status: DC | PRN
  Administered 2017-04-06: 50 ug via INTRAVENOUS
  Administered 2017-04-06 (×2): 25 ug via INTRAVENOUS

## 2017-04-06 MED ORDER — ONDANSETRON HCL 4 MG PO TABS: 4.0000 mg | ORAL_TABLET | Freq: Three times a day (TID) | ORAL | 1 refills | Status: DC | PRN

## 2017-04-06 MED ORDER — MIDAZOLAM HCL 2 MG/2ML IJ SOLN
INTRAMUSCULAR | Status: DC | PRN
  Administered 2017-04-06: 2 mg via INTRAVENOUS

## 2017-04-06 MED ORDER — CEFAZOLIN SODIUM-DEXTROSE 2-4 GM/100ML-% IV SOLN
INTRAVENOUS | Status: AC
  Filled 2017-04-06: qty 100

## 2017-04-06 MED ORDER — LIDOCAINE HCL (CARDIAC) 20 MG/ML IV SOLN
INTRAVENOUS | Status: DC | PRN
  Administered 2017-04-06: 40 mg via INTRAVENOUS

## 2017-04-06 MED ORDER — BUPIVACAINE HCL (PF) 0.5 % IJ SOLN
INTRAMUSCULAR | Status: DC | PRN
  Administered 2017-04-06: 8 mL

## 2017-04-06 MED ORDER — DEXAMETHASONE SODIUM PHOSPHATE 10 MG/ML IJ SOLN
INTRAMUSCULAR | Status: AC
  Filled 2017-04-06: qty 1

## 2017-04-06 MED ORDER — LACTATED RINGERS IV SOLN
INTRAVENOUS | Status: DC
  Administered 2017-04-06 (×2): via INTRAVENOUS

## 2017-04-06 MED ORDER — PROPOFOL 10 MG/ML IV BOLUS
INTRAVENOUS | Status: DC | PRN
  Administered 2017-04-06: 140 mg via INTRAVENOUS

## 2017-04-06 MED ORDER — ONDANSETRON HCL 4 MG/2ML IJ SOLN
INTRAMUSCULAR | Status: AC
  Filled 2017-04-06: qty 2

## 2017-04-06 MED ORDER — ONDANSETRON HCL 4 MG/2ML IJ SOLN
INTRAMUSCULAR | Status: DC | PRN
  Administered 2017-04-06: 4 mg via INTRAVENOUS

## 2017-04-06 MED ORDER — CHLORHEXIDINE GLUCONATE CLOTH 2 % EX PADS: 6.0000 | MEDICATED_PAD | Freq: Once | CUTANEOUS | Status: DC

## 2017-04-06 MED ORDER — FENTANYL CITRATE (PF) 100 MCG/2ML IJ SOLN: 25.0000 ug | INTRAMUSCULAR | Status: DC | PRN

## 2017-04-06 MED ORDER — LIDOCAINE-EPINEPHRINE (PF) 1 %-1:200000 IJ SOLN
INTRAMUSCULAR | Status: DC | PRN
  Administered 2017-04-06: 8 mL

## 2017-04-06 MED ORDER — BUPIVACAINE HCL (PF) 0.5 % IJ SOLN
INTRAMUSCULAR | Status: AC
  Filled 2017-04-06: qty 30

## 2017-04-06 MED ORDER — FAMOTIDINE 20 MG PO TABS
ORAL_TABLET | ORAL | Status: AC
  Administered 2017-04-06: 20 mg via ORAL
  Filled 2017-04-06: qty 1

## 2017-04-06 MED ORDER — LIDOCAINE-EPINEPHRINE (PF) 1 %-1:200000 IJ SOLN
INTRAMUSCULAR | Status: AC
  Filled 2017-04-06: qty 30

## 2017-04-06 MED ORDER — KETOROLAC TROMETHAMINE 30 MG/ML IJ SOLN
INTRAMUSCULAR | Status: DC | PRN
  Administered 2017-04-06: 30 mg via INTRAVENOUS

## 2017-04-06 MED ORDER — ONDANSETRON HCL 4 MG/2ML IJ SOLN: 4.0000 mg | Freq: Once | INTRAMUSCULAR | Status: DC | PRN

## 2017-04-06 MED ORDER — KETOROLAC TROMETHAMINE 30 MG/ML IJ SOLN
INTRAMUSCULAR | Status: AC
  Filled 2017-04-06: qty 1

## 2017-04-06 MED ORDER — FENTANYL CITRATE (PF) 100 MCG/2ML IJ SOLN
INTRAMUSCULAR | Status: AC
  Filled 2017-04-06: qty 2

## 2017-04-06 MED ORDER — DEXAMETHASONE SODIUM PHOSPHATE 10 MG/ML IJ SOLN
INTRAMUSCULAR | Status: DC | PRN
  Administered 2017-04-06: 5 mg via INTRAVENOUS

## 2017-04-06 SURGICAL SUPPLY — 33 items
BLADE SURG 15 STRL LF DISP TIS (BLADE) ×1 IMPLANT
BLADE SURG 15 STRL SS (BLADE) ×2
CANISTER SUCT 1200ML W/VALVE (MISCELLANEOUS) ×3 IMPLANT
CHLORAPREP W/TINT 26ML (MISCELLANEOUS) ×3 IMPLANT
COVER PROBE FLX POLY STRL (MISCELLANEOUS) ×3 IMPLANT
DERMABOND ADVANCED (GAUZE/BANDAGES/DRESSINGS) ×2
DERMABOND ADVANCED .7 DNX12 (GAUZE/BANDAGES/DRESSINGS) ×1 IMPLANT
DEVICE DUBIN SPECIMEN MAMMOGRA (MISCELLANEOUS) ×3 IMPLANT
DRAPE LAPAROTOMY TRNSV 106X77 (MISCELLANEOUS) ×3 IMPLANT
ELECT CAUTERY BLADE 6.4 (BLADE) ×3 IMPLANT
ELECT REM PT RETURN 9FT ADLT (ELECTROSURGICAL) ×3
ELECTRODE REM PT RTRN 9FT ADLT (ELECTROSURGICAL) ×1 IMPLANT
GLOVE BIO SURGEON STRL SZ7.5 (GLOVE) ×3 IMPLANT
GLOVE BIOGEL PI IND STRL 7.0 (GLOVE) ×2 IMPLANT
GLOVE BIOGEL PI INDICATOR 7.0 (GLOVE) ×4
GLOVE INDICATOR 8.0 STRL GRN (GLOVE) ×3 IMPLANT
GLOVE PROTEXIS LATEX SZ 7.5 (GLOVE) ×6 IMPLANT
GOWN STRL REUS W/ TWL LRG LVL3 (GOWN DISPOSABLE) ×1 IMPLANT
GOWN STRL REUS W/ TWL XL LVL3 (GOWN DISPOSABLE) ×1 IMPLANT
GOWN STRL REUS W/TWL LRG LVL3 (GOWN DISPOSABLE) ×2
GOWN STRL REUS W/TWL XL LVL3 (GOWN DISPOSABLE) ×2
LABEL OR SOLS (LABEL) ×3 IMPLANT
MARGIN MAP 10MM (MISCELLANEOUS) ×3 IMPLANT
NEEDLE HYPO 25X1 1.5 SAFETY (NEEDLE) ×3 IMPLANT
PACK BASIN MINOR ARMC (MISCELLANEOUS) ×3 IMPLANT
SUT MNCRL 4-0 (SUTURE) ×2
SUT MNCRL 4-0 27XMFL (SUTURE) ×1
SUT SILK 2 0 SH (SUTURE) ×3 IMPLANT
SUT VIC AB 3-0 SH 27 (SUTURE) ×2
SUT VIC AB 3-0 SH 27X BRD (SUTURE) ×1 IMPLANT
SUTURE MNCRL 4-0 27XMF (SUTURE) ×1 IMPLANT
SYR 10ML LL (SYRINGE) ×3 IMPLANT
WATER STERILE IRR 1000ML POUR (IV SOLUTION) ×3 IMPLANT

## 2017-04-06 NOTE — Brief Op Note (Signed)
04/06/2017  11:59 AM  PATIENT:  Shelly Ward  52 y.o. female  PRE-OPERATIVE DIAGNOSIS:  papilloma of right breast  POST-OPERATIVE DIAGNOSIS:  papilloma of right breast  PROCEDURE:  Procedure(s): BREAST BIOPSY WITH NEEDLE LOCALIZATION (Right)  SURGEON:  Surgeon(s) and Role:    Clayburn Pert, MD - Primary  PHYSICIAN ASSISTANT:   ASSISTANTS: none   ANESTHESIA:   general  EBL:  10 mL   BLOOD ADMINISTERED:none  DRAINS: none   LOCAL MEDICATIONS USED:  MARCAINE   , XYLOCAINE  and Amount: 16 ml  SPECIMEN:  Source of Specimen:  right breast mass  DISPOSITION OF SPECIMEN:  PATHOLOGY  COUNTS:  YES  TOURNIQUET:  * No tourniquets in log *  DICTATION: .Dragon Dictation  PLAN OF CARE: Discharge to home after PACU  PATIENT DISPOSITION:  PACU - hemodynamically stable.   Delay start of Pharmacological VTE agent (>24hrs) due to surgical blood loss or risk of bleeding: not applicable

## 2017-04-06 NOTE — Anesthesia Post-op Follow-up Note (Signed)
Anesthesia QCDR form completed.        

## 2017-04-06 NOTE — Transfer of Care (Signed)
Immediate Anesthesia Transfer of Care Note  Patient: Shelly Ward  Procedure(s) Performed: BREAST BIOPSY WITH NEEDLE LOCALIZATION (Right Breast)  Patient Location: PACU  Anesthesia Type:General  Level of Consciousness: sedated  Airway & Oxygen Therapy: Patient Spontanous Breathing and Patient connected to face mask oxygen  Post-op Assessment: Report given to RN and Post -op Vital signs reviewed and stable  Post vital signs: Reviewed and stable  Last Vitals:  Vitals:   04/06/17 0738  BP: 121/72  Pulse: 78  Resp: 18  Temp: (!) 36.1 C  SpO2: 98%    Last Pain:  Vitals:   04/06/17 0738  TempSrc: Tympanic         Complications: No apparent anesthesia complications

## 2017-04-06 NOTE — H&P (Signed)
Shelly Ward is an 52 y.o. female.       Chief Complaint  Patient presents with  . Follow-up    Right Breast Nodule- patient requested to come back in december to update H&P and schedule surgery in January    HPI: 52 year old female with a right breast papilloma. She returns today for surgical excision of the area. Patient reports she still cannot feel the area.  She denies any changes since her last visit.  She denies any pain or palpable breast masses.  She denies any fevers, chills, nausea, vomiting, chest pain, shortness of breath, diarrhea, constipation.  She is otherwise in her usual state of health.      Past Medical History:  Diagnosis Date  . Acute anxiety 08/21/2014  . Anxiety   . Depression 01/23/2017  . 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 Scoliosis 01/23/2017         Past Surgical History:  Procedure Laterality Date  . BREAST BIOPSY Right   . TONSILLECTOMY           Family History  Problem Relation Age of Onset  . Arthritis Mother   . Diabetes Father     Social History:  reports that she has been smoking cigarettes.  She has a 7.50 pack-year smoking history. she has never used smokeless tobacco. She reports that she does not drink alcohol or use drugs.  Allergies: No Known Allergies  Medications reviewed.    ROS A multipoint review of systems was completed, all pertinent positives and negatives were document within the HPI and the remainder are negative   BP 115/77   Pulse 93   Temp 97.8 F (36.6 C) (Oral)   Wt 63.5 kg (140 lb)   BMI 25.61 kg/m   Physical Exam  General: No acute distress Neck: Supple nontender Breast: Bilateral breast examined.  There are no palpable masses or lesions to either breast or axilla. Chest: Clear to auscultation  Heart: Regular rate and rhythm Abdomen: Soft nontender   LabResultsLast48Hours        Results for orders  placed or performed in visit on 02/22/17 (from the past 48 hour(s))  POCT urinalysis dipstick     Status: Abnormal   Collection Time: 02/22/17 11:23 AM  Result Value Ref Range   Color, UA yellow    Clarity, UA cloudy    Glucose, UA negative    Bilirubin, UA negative    Ketones, UA negative    Spec Grav, UA >=1.030 (A) 1.010 - 1.025   Blood, UA 3+     Comment: started period   pH, UA 5.0 5.0 - 8.0   Protein, UA negative    Urobilinogen, UA 0.2 0.2 or 1.0 E.U./dL   Nitrite, UA negative    Leukocytes, UA Negative Negative   Appearance yellow    Odor none      ImagingResults(Last48hours)  No results found.    Assessment/Plan:  1. Papilloma of right breast 52 year old female with a right breast papilloma that has increased in size.  Discussed again the procedure of a needle localized breast biopsy in detail.  Patient voiced understanding and desires to proceed.  Patient has already had her needle localization performed at the time of this H&P update.     Clayburn Pert, MD [facility] General Surgeon

## 2017-04-06 NOTE — Discharge Instructions (Signed)
Breast Biopsy, Care After These instructions give you information about caring for yourself after your procedure. Your doctor may also give you more specific instructions. Call your doctor if you have any problems or questions after your procedure. Follow these instructions at home: Medicines  Take over-the-counter and prescription medicines only as told by your doctor.  Do not drive for 24 hours if you received a sedative.  Do not drink alcohol while taking pain medicine.  Do not drive or use heavy machinery while taking prescription pain medicine. Biopsy Site Care   Follow instructions from your doctor about how to take care of your cut from surgery (incision) or puncture area. Make sure you: ? Wash your hands with soap and water before you change your bandage. If you cannot use soap and water, use hand sanitizer. ? Change any bandages (dressings) as told by your doctor. ? Leave any stitches (sutures), skin glue, or skin tape (adhesive) strips in place. They may need to stay in place for 2 weeks or longer. If tape strips get loose and curl up, you may trim the loose edges. Do not remove tape strips completely unless your doctor says it is okay.  If you have stitches, keep them dry when you take a bath or a shower.  Okay to shower in 24 hours.  Check your cut or puncture area every day for signs of infection. Check for: ? More redness, swelling, or pain. ? More fluid or blood. ? Warmth. ? Pus or a bad smell.  Protect the biopsy area. Do not let the area get bumped. Activity  Avoid activities that could pull the biopsy site open. ? Avoid stretching. ? Avoid reaching. ? Avoid exercise. ? Avoid sports. ? Avoid lifting anything that is heavier than 3 pounds (1.4 kg).  Return to your normal activities as told by your doctor. Ask your doctor what activities are safe for you. General instructions  Continue your normal diet.  Wear a good support bra for as long as told by your  doctor.  Get checked for extra fluid in your body (lymphedema) as often as told by your doctor.  Keep all follow-up visits as told by your doctor. This is important. Contact a health care provider if:  You have more redness, swelling, or pain at the biopsy site.  You have more fluid or blood coming from your biopsy site.  Your biopsy site feels warm to the touch.  You have pus or a bad smell coming from the biopsy site.  Your biopsy site breaks open after the stitches, staples, or skin tape strips have been removed.  You have a rash.  You have a fever. Get help right away if:  You have more bleeding (more than a small spot) from the biopsy site.  You have trouble breathing.  You have red streaks around the biopsy site. This information is not intended to replace advice given to you by your health care provider. Make sure you discuss any questions you have with your health care provider. Document Released: 12/18/2008 Document Revised: 10/29/2015 Document Reviewed: 11/25/2014 Elsevier Interactive Patient Education  2018 [employer].

## 2017-04-06 NOTE — Op Note (Signed)
   Pre-operative Diagnosis: Right breast papilloma  Post-operative Diagnosis: Same  Procedure performed: Needle localized excisional right breast biopsy.  Surgeon: Clayburn Pert   Assistants: None  Anesthesia: General LMA anesthesia  ASA Class: 2  Surgeon: Clayburn Pert, MD FACS  Anesthesia: Gen. with endotracheal tube  Assistant: None  Procedure Details  The patient was seen again in the Holding Room. The benefits, complications, treatment options, and expected outcomes were discussed with the patient. The risks of bleeding, infection, recurrence of symptoms, failure to resolve symptoms, superficial breast tissue injury, any of which could require further surgery were reviewed with the patient.   The patient was taken to Operating Room, identified as Shelly Ward and the procedure verified.  A Time Out was held and the above information confirmed.  Patient underwent radiographic needle localization prior to coming to the operating room.  Prior to the induction of general anesthesia, antibiotic prophylaxis was administered. VTE prophylaxis was in place. General LMA anesthesia was then administered and tolerated well. After the induction, the right chest was prepped with Chloraprep and draped in the sterile fashion. The patient was positioned in the supine position.  The procedure began with localization of an infra-areolar region and the skin towards the previous placed needle with a 50-50 mixture 1% lidocaine 0.5% Marcaine plain.  An infra-areolar incision was made with 15 blade scalpel using Bovie electrocautery was taken down to the level of the dermal fat.  A plane was dissected towards the needle where the wire could be brought into operative field.  The wire was grasped at the breast tissue and circumferentially dissected out with electrocautery.  A palpable area of abnormality coinciding with the presumed papilloma was identified and excised with the specimen.  It was  excised in toto and passed off the field as a specimen.  Prior to being passed off it was marked medially superficially and cranially.  The entire field was copes irrigated and meticulous hemostasis was ensured with electrocautery.  The dermal tissue was reapproximated with interrupted 3-0 Vicryl.  The skin was reapproximated with a subcuticular 4-0 Monocryl.  Radiology called back confirming that the previously placed clip and area of concern was within the specimen.  The skin was then sealed with Dermabond.  The patient tolerated the procedure well.  There were no immediate complications.  All counts were correct at the end of the procedure.  She was awoken from anesthesia and transferred to the PACU in good condition.  Findings: Right breast mass   Estimated Blood Loss: 10 mL         Drains: None         Specimens: Right breast mass          Complications: None                  Condition: Good   Clayburn Pert, MD, FACS

## 2017-04-06 NOTE — Anesthesia Procedure Notes (Signed)
Procedure Name: LMA Insertion Date/Time: 04/06/2017 11:07 AM Performed by: Allean Found, CRNA Pre-anesthesia Checklist: Patient identified, Emergency Drugs available, Suction available, Patient being monitored and Timeout performed Patient Re-evaluated:Patient Re-evaluated prior to induction Oxygen Delivery Method: Circle system utilized Preoxygenation: Pre-oxygenation with 100% oxygen Induction Type: IV induction Ventilation: Mask ventilation without difficulty LMA: LMA inserted LMA Size: 4.0 Number of attempts: 1 Tube secured with: Tape Dental Injury: Teeth and Oropharynx as per pre-operative assessment

## 2017-04-06 NOTE — OR Nursing (Signed)
DISCHARGE INSTRUCTIONS DISCUSSED WITH PT AND FAMILY. BOTH VOICE UNDERSTANDING.

## 2017-04-06 NOTE — Anesthesia Preprocedure Evaluation (Signed)
Anesthesia Evaluation  Patient identified by MRN, date of birth, ID band Patient awake    Reviewed: Allergy & Precautions, H&P , NPO status , Patient's Chart, lab work & pertinent test results, reviewed documented beta blocker date and time   Airway Mallampati: II  TM Distance: >3 FB Neck ROM: full    Dental  (+) Teeth Intact   Pulmonary neg pulmonary ROS, Current Smoker,    Pulmonary exam normal        Cardiovascular Exercise Tolerance: Good negative cardio ROS Normal cardiovascular exam Rate:Normal     Neuro/Psych PSYCHIATRIC DISORDERS negative neurological ROS  negative psych ROS   GI/Hepatic negative GI ROS, Neg liver ROS, GERD  Medicated,  Endo/Other  negative endocrine ROS  Renal/GU negative Renal ROS  negative genitourinary   Musculoskeletal   Abdominal   Peds  Hematology negative hematology ROS (+)   Anesthesia Other Findings   Reproductive/Obstetrics negative OB ROS                             Anesthesia Physical Anesthesia Plan  ASA: II  Anesthesia Plan: General LMA   Post-op Pain Management:    Induction:   PONV Risk Score and Plan: 3  Airway Management Planned:   Additional Equipment:   Intra-op Plan:   Post-operative Plan:   Informed Consent: I have reviewed the patients History and Physical, chart, labs and discussed the procedure including the risks, benefits and alternatives for the proposed anesthesia with the patient or authorized representative who has indicated his/her understanding and acceptance.     Plan Discussed with: CRNA  Anesthesia Plan Comments:         Anesthesia Quick Evaluation

## 2017-04-07 LAB — SURGICAL PATHOLOGY

## 2017-04-07 NOTE — Anesthesia Postprocedure Evaluation (Signed)
Anesthesia Post Note  Patient: Shelly Ward  Procedure(s) Performed: BREAST BIOPSY WITH NEEDLE LOCALIZATION (Right Breast)  Patient location during evaluation: PACU Anesthesia Type: General Level of consciousness: awake and alert Pain management: pain level controlled Vital Signs Assessment: post-procedure vital signs reviewed and stable Respiratory status: spontaneous breathing, nonlabored ventilation, respiratory function stable and patient connected to nasal cannula oxygen Cardiovascular status: blood pressure returned to baseline and stable Postop Assessment: no apparent nausea or vomiting Anesthetic complications: no     Last Vitals:  Vitals:   04/06/17 1323 04/06/17 1328  BP: (!) 143/82 136/86  Pulse: 68 70  Resp: 18 16  Temp:    SpO2: 100% 100%    Last Pain:  Vitals:   04/07/17 0841  TempSrc:   PainSc: 0-No pain                 Molli Barrows

## 2017-04-10 ENCOUNTER — Other Ambulatory Visit: Payer: Self-pay

## 2017-04-10 ENCOUNTER — Encounter: Payer: Self-pay | Admitting: *Deleted

## 2017-04-10 DIAGNOSIS — T3695XA Adverse effect of unspecified systemic antibiotic, initial encounter: Principal | ICD-10-CM

## 2017-04-10 DIAGNOSIS — B379 Candidiasis, unspecified: Secondary | ICD-10-CM

## 2017-04-10 MED ORDER — FLUCONAZOLE 150 MG PO TABS
150.0000 mg | ORAL_TABLET | Freq: Once | ORAL | 0 refills | Status: AC
Start: 2017-04-10 — End: 2017-04-10

## 2017-04-10 NOTE — Progress Notes (Signed)
Patient with benign biopsy results.  Will follow with next screening mammogram in 1 year unless otherwise indicated by Dr. Adonis Huguenin.  Patient has post surgery follow up with him on 04/20/17.  HSIS to [place].

## 2017-04-20 ENCOUNTER — Ambulatory Visit (INDEPENDENT_AMBULATORY_CARE_PROVIDER_SITE_OTHER): Payer: Self-pay | Admitting: General Surgery

## 2017-04-20 ENCOUNTER — Encounter: Payer: Self-pay | Admitting: General Surgery

## 2017-04-20 VITALS — BP 135/82 | HR 96 | Temp 98.3°F | Ht 62.0 in | Wt 143.0 lb

## 2017-04-20 DIAGNOSIS — Z4889 Encounter for other specified surgical aftercare: Secondary | ICD-10-CM

## 2017-04-20 NOTE — Progress Notes (Signed)
Outpatient Surgical Follow Up  04/20/2017  Shelly Ward is an 52 y.o. female.   Chief Complaint  Patient presents with  . Routine Post Op    Breast biopsy w/needle localization 04/06/17 Dr.Briany Aye     HPI: 52 year old female returns to clinic now 2 weeks status post right breast needle localized breast biopsy.  Patient reports doing well.  She denies any pain.  She is already returned to her normal activities.  She states she never required any pain medications after surgery.  She denies any fevers, chills, nausea, vomiting, chest pain, shortness of breath, diarrhea, constipation.  Her primary complaint currently is from her hip, she sees a different provider for her hip complaints.  Past Medical History:  Diagnosis Date  . Acute anxiety 08/21/2014  . Anxiety   . Arthritis    hip  . Depression 01/23/2017  . 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 GERD (gastroesophageal reflux disease)    occ-no meds  . 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 Scoliosis 01/23/2017    Past Surgical History:  Procedure Laterality Date  . BREAST BIOPSY Right   . BREAST BIOPSY Right 04/06/2017   Procedure: BREAST BIOPSY WITH NEEDLE LOCALIZATION;  Surgeon: Clayburn Pert, MD;  Location: [facility] ORS;  Service: General;  Laterality: Right;  . TONSILLECTOMY    . WISDOM TOOTH EXTRACTION      Family History  Problem Relation Age of Onset  . Arthritis Mother   . Diabetes Father     Social History:  reports that she has been smoking cigarettes.  She has a 7.50 pack-year smoking history. she has never used smokeless tobacco. She reports that she does not drink alcohol or use drugs.  Allergies: No Known Allergies  Medications reviewed.    ROS A multipoint review of systems was completed, all pertinent positives and negatives are documented within the HPI and the remainder are negative   BP 135/82   Pulse 96   Temp 98.3 F (36.8 C) (Oral)   Ht 5\' 2"  (1.575 m)   Wt 64.9 kg (143 lb)    BMI 26.16 kg/m   Physical Exam General: No acute distress Neck: Supple nontender Breast: Right breast surgical site well approximated with Dermabond in place.  No evidence of erythema or drainage.  Resolving ecchymosis around the inferior lateral aspect. Chest: Clear to auscultation Heart: Regular rate and rhythm Abdomen: Soft and nontender    No results found for this or any previous visit (from the past 48 hour(s)). No results found.  Assessment/Plan:  1. Aftercare following surgery 52 year old female status post right breast needle localized biopsy.  Pathology reviewed with the patient that showed a papilloma.  No evidence of malignancy.  Discussed the importance of self exams and repeat images in 6 months.  Also discussed the signs and symptoms of infection.  Provided with standard postoperative precautions and she will follow-up in clinic on as-needed basis.     Clayburn Pert, MD FACS General Surgeon  04/20/2017,4:32 PM

## 2017-04-20 NOTE — Patient Instructions (Signed)
We will call you once we have scheduled your 6 month Mammogram appointment  As well as a follow up for the surgeon to do a breast exam.      GENERAL POST-OPERATIVE PATIENT INSTRUCTIONS   WOUND CARE INSTRUCTIONS:  Keep a dry clean dressing on the wound if there is drainage. The initial bandage may be removed after 24 hours.  Once the wound has quit draining you may leave it open to air.  If clothing rubs against the wound or causes irritation and the wound is not draining you may cover it with a dry dressing during the daytime.  Try to keep the wound dry and avoid ointments on the wound unless directed to do so.  If the wound becomes bright red and painful or starts to drain infected material that is not clear, please contact your physician immediately.  If the wound is mildly pink and has a thick firm ridge underneath it, this is normal, and is referred to as a healing ridge.  This will resolve over the next 4-6 weeks.  BATHING: You may shower if you have been informed of this by your surgeon. However, Please do not submerge in a tub, hot tub, or pool until incisions are completely sealed or have been told by your surgeon that you may do so.  DIET:  You may eat any foods that you can tolerate.  It is a good idea to eat a high fiber diet and take in plenty of fluids to prevent constipation.  If you do become constipated you may want to take a mild laxative or take ducolax tablets on a daily basis until your bowel habits are regular.  Constipation can be very uncomfortable, along with straining, after recent surgery.  ACTIVITY:  You are encouraged to cough and deep breath or use your incentive spirometer if you were given one, every 15-30 minutes when awake.  This will help prevent respiratory complications and low grade fevers post-operatively if you had a general anesthetic.  You may want to hug a pillow when coughing and sneezing to add additional support to the surgical area, if you had abdominal or  chest surgery, which will decrease pain during these times.  You are encouraged to walk and engage in light activity for the next two weeks.  You should not lift more than 20 pounds, until 04/20/17 as it could put you at increased risk for complications.  Twenty pounds is roughly equivalent to a plastic bag of groceries. At that time- Listen to your body when lifting, if you have pain when lifting, stop and then try again in a few days. Soreness after doing exercises or activities of daily living is normal as you get back in to your normal routine.  MEDICATIONS:  Try to take narcotic medications and anti-inflammatory medications, such as tylenol, ibuprofen, naprosyn, etc., with food.  This will minimize stomach upset from the medication.  Should you develop nausea and vomiting from the pain medication, or develop a rash, please discontinue the medication and contact your physician.  You should not drive, make important decisions, or operate machinery when taking narcotic pain medication.  SUNBLOCK Use sun block to incision area over the next year if this area will be exposed to sun. This helps decrease scarring and will allow you avoid a permanent darkened area over your incision.  QUESTIONS:  Please feel free to call our office if you have any questions, and we will be glad to assist you. [phone]

## 2017-04-21 ENCOUNTER — Telehealth: Payer: Self-pay

## 2017-04-21 NOTE — Telephone Encounter (Signed)
Left message for [facility] to return call to schedule  patient for 6 month follow up appointment for Mammogram.

## 2017-05-10 ENCOUNTER — Other Ambulatory Visit: Payer: Self-pay

## 2017-05-15 ENCOUNTER — Telehealth: Payer: Self-pay

## 2017-05-15 DIAGNOSIS — Z1231 Encounter for screening mammogram for malignant neoplasm of breast: Secondary | ICD-10-CM

## 2017-05-15 NOTE — Telephone Encounter (Signed)
Left message for patient to return call.  Mammogram scheduled for 12/13/17 @ 2 pm.  When template is available, patient will need to schedule follow up breast exam in October 2019 as well.

## 2017-05-15 NOTE — Telephone Encounter (Signed)
Error

## 2017-05-15 NOTE — Telephone Encounter (Signed)
Patient notified of mammogram appointment 12/13/17 @ 2 pm. Appointment reminder mailed today.   Need to schedule patient follow up appointment after mammogram to see a provider. Template is not available for October as of yet.

## 2017-11-08 ENCOUNTER — Telehealth: Payer: Self-pay

## 2017-11-08 NOTE — Telephone Encounter (Signed)
1. 12/18/17 Mammogram @ 3:20 pm 2. 12/10/17 Dr.Piscoya @ 11:am   Patient notified of appointments listed above.

## 2017-12-18 ENCOUNTER — Inpatient Hospital Stay: Admission: RE | Admit: 2017-12-18 | Payer: Self-pay | Source: Ambulatory Visit

## 2017-12-20 ENCOUNTER — Ambulatory Visit: Payer: Self-pay | Admitting: Surgery

## 2018-01-05 ENCOUNTER — Ambulatory Visit: Payer: Self-pay

## 2018-01-10 ENCOUNTER — Ambulatory Visit: Payer: Self-pay | Admitting: Surgery

## 2018-01-12 ENCOUNTER — Other Ambulatory Visit: Payer: Self-pay

## 2018-01-12 ENCOUNTER — Telehealth: Payer: Self-pay

## 2018-01-12 NOTE — Telephone Encounter (Signed)
Spoke with patient at this time. Patient is enrolled in [facility] program and has an appointment in January 2020.

## 2018-03-12 ENCOUNTER — Encounter (INDEPENDENT_AMBULATORY_CARE_PROVIDER_SITE_OTHER): Payer: Self-pay

## 2018-03-12 ENCOUNTER — Ambulatory Visit
Admission: RE | Admit: 2018-03-12 | Discharge: 2018-03-12 | Disposition: A | Discharge status: Home / Self Care | Payer: Self-pay | Source: Ambulatory Visit | Attending: General Surgery | Admitting: General Surgery

## 2018-03-12 ENCOUNTER — Other Ambulatory Visit: Payer: Self-pay | Admitting: Family Medicine

## 2018-03-12 ENCOUNTER — Ambulatory Visit: Payer: Self-pay | Attending: Oncology

## 2018-03-12 VITALS — BP 103/69 | HR 74 | Temp 97.8°F | Ht 61.5 in | Wt 146.6 lb

## 2018-03-12 DIAGNOSIS — F419 Anxiety disorder, unspecified: Secondary | ICD-10-CM

## 2018-03-12 DIAGNOSIS — Z0000 Encounter for general adult medical examination without abnormal findings: Secondary | ICD-10-CM

## 2018-03-12 DIAGNOSIS — Z1231 Encounter for screening mammogram for malignant neoplasm of breast: Secondary | ICD-10-CM | POA: Insufficient documentation

## 2018-03-12 NOTE — Progress Notes (Signed)
Letter mailed from [facility] to notify of normal mammogram results.  Patient to return in one year for annual screening.  Copy to [facility]. 

## 2018-03-12 NOTE — Progress Notes (Signed)
  Subjective:     Patient ID: Shelly Ward, female   DOB: Jul 25, 1965, 53 y.o.   MRN: [id number]  HPI   Review of Systems     Objective:   Physical Exam Chest:     Breasts:        Right: No swelling, bleeding, inverted nipple, mass, nipple discharge, skin change or tenderness.        Left: No swelling, bleeding, inverted nipple, mass, nipple discharge, skin change or tenderness.       Comments: Right breast biopsy scar        Assessment:     53 year old patient returns for [facility] clinic visit. She had a benign right breast biopsy with Dr. Adonis Huguenin in January 2019.    Patient screened, and meets BCCCP eligibility.  Patient does not have insurance, Medicare or Medicaid.  Handout given on Affordable Care Act. Instructed patient on breast self awareness using teach back method.  Clinical breast exam unremarkable.  No mass or lump palpated.  Risk Assessment    Risk Scores      03/12/2018   Last edited by: Orson Slick, CMA   5-year risk: 1.4 %   Lifetime risk: 12.7 %             Plan:     Sent for bilateral screening mammogram.

## 2018-03-16 ENCOUNTER — Ambulatory Visit (INDEPENDENT_AMBULATORY_CARE_PROVIDER_SITE_OTHER): Payer: Self-pay | Admitting: Family Medicine

## 2018-03-16 ENCOUNTER — Encounter: Payer: Self-pay | Admitting: Family Medicine

## 2018-03-16 VITALS — BP 110/80 | HR 80 | Ht 61.5 in | Wt 147.0 lb

## 2018-03-16 DIAGNOSIS — F419 Anxiety disorder, unspecified: Secondary | ICD-10-CM

## 2018-03-16 MED ORDER — FLUOXETINE HCL 10 MG PO TABS: 10.0000 mg | ORAL_TABLET | Freq: Every day | ORAL | 1 refills | Status: AC

## 2018-03-16 NOTE — Patient Instructions (Signed)
Mediterranean Diet  A Mediterranean diet refers to food and lifestyle choices that are based on the traditions of countries located on the [place]. This way of eating has been shown to help prevent certain conditions and improve outcomes for people who have chronic diseases, like kidney disease and heart disease.  What are tips for following this plan?  Lifestyle   Cook and eat meals together with your family, when possible.   Drink enough fluid to keep your urine clear or pale yellow.   Be physically active every day. This includes:  ? Aerobic exercise like running or swimming.  ? Leisure activities like gardening, walking, or housework.   Get 7-8 hours of sleep each night.   If recommended by your health care provider, drink red wine in moderation. This means 1 glass a day for nonpregnant women and 2 glasses a day for men. A glass of wine equals 5 oz (150 mL).  Reading food labels     Check the serving size of packaged foods. For foods such as rice and pasta, the serving size refers to the amount of cooked product, not dry.   Check the total fat in packaged foods. Avoid foods that have saturated fat or trans fats.   Check the ingredients list for added sugars, such as corn syrup.  Shopping   At the grocery store, buy most of your food from the areas near the walls of the store. This includes:  ? Fresh fruits and vegetables (produce).  ? Grains, beans, nuts, and seeds. Some of these may be available in unpackaged forms or large amounts (in bulk).  ? Fresh seafood.  ? Poultry and eggs.  ? Low-fat dairy products.   Buy whole ingredients instead of prepackaged foods.   Buy fresh fruits and vegetables in-season from local farmers markets.   Buy frozen fruits and vegetables in resealable bags.   If you do not have access to quality fresh seafood, buy precooked frozen shrimp or canned fish, such as tuna, salmon, or sardines.   Buy small amounts of raw or cooked vegetables, salads, or olives from  the deli or salad bar at your store.   Stock your pantry so you always have certain foods on hand, such as olive oil, canned tuna, canned tomatoes, rice, pasta, and beans.  Cooking   Cook foods with extra-virgin olive oil instead of using butter or other vegetable oils.   Have meat as a side dish, and have vegetables or grains as your main dish. This means having meat in small portions or adding small amounts of meat to foods like pasta or stew.   Use beans or vegetables instead of meat in common dishes like chili or lasagna.   Experiment with different cooking methods. Try roasting or broiling vegetables instead of steaming or sauteing them.   Add frozen vegetables to soups, stews, pasta, or rice.   Add nuts or seeds for added healthy fat at each meal. You can add these to yogurt, salads, or vegetable dishes.   Marinate fish or vegetables using olive oil, lemon juice, garlic, and fresh herbs.  Meal planning     Plan to eat 1 vegetarian meal one day each week. Try to work up to 2 vegetarian meals, if possible.   Eat seafood 2 or more times a week.   Have healthy snacks readily available, such as:  ? Vegetable sticks with hummus.  ? Greek yogurt.  ? Fruit and nut trail mix.   Eat balanced   meals throughout the week. This includes:  ? Fruit: 2-3 servings a day  ? Vegetables: 4-5 servings a day  ? Low-fat dairy: 2 servings a day  ? Fish, poultry, or lean meat: 1 serving a day  ? Beans and legumes: 2 or more servings a week  ? Nuts and seeds: 1-2 servings a day  ? Whole grains: 6-8 servings a day  ? Extra-virgin olive oil: 3-4 servings a day   Limit red meat and sweets to only a few servings a month  What are my food choices?   Mediterranean diet  ? Recommended  ? Grains: Whole-grain pasta. Brown rice. Bulgar wheat. Polenta. Couscous. Whole-wheat bread. Oatmeal. Quinoa.  ? Vegetables: Artichokes. Beets. Broccoli. Cabbage. Carrots. Eggplant. Green beans. Chard. Kale. Spinach. Onions. Leeks. Peas. Squash.  Tomatoes. Peppers. Radishes.  ? Fruits: Apples. Apricots. Avocado. Berries. Bananas. Cherries. Dates. Figs. Grapes. Lemons. Melon. Oranges. Peaches. Plums. Pomegranate.  ? Meats and other protein foods: Beans. Almonds. Sunflower seeds. Pine nuts. Peanuts. Cod. Salmon. Scallops. Shrimp. Tuna. Tilapia. Clams. Oysters. Eggs.  ? Dairy: Low-fat milk. Cheese. Greek yogurt.  ? Beverages: Water. Red wine. Herbal tea.  ? Fats and oils: Extra virgin olive oil. Avocado oil. Grape seed oil.  ? Sweets and desserts: Greek yogurt with honey. Baked apples. Poached pears. Trail mix.  ? Seasoning and other foods: Basil. Cilantro. Coriander. Cumin. Mint. Parsley. Sage. Rosemary. Tarragon. Garlic. Oregano. Thyme. Pepper. Balsalmic vinegar. Tahini. Hummus. Tomato sauce. Olives. Mushrooms.  ? Limit these  ? Grains: Prepackaged pasta or rice dishes. Prepackaged cereal with added sugar.  ? Vegetables: Deep fried potatoes (french fries).  ? Fruits: Fruit canned in syrup.  ? Meats and other protein foods: Beef. Pork. Lamb. Poultry with skin. Hot dogs. Bacon.  ? Dairy: Ice cream. Sour cream. Whole milk.  ? Beverages: Juice. Sugar-sweetened soft drinks. Beer. Liquor and spirits.  ? Fats and oils: Butter. Canola oil. Vegetable oil. Beef fat (tallow). Lard.  ? Sweets and desserts: Cookies. Cakes. Pies. Candy.  ? Seasoning and other foods: Mayonnaise. Premade sauces and marinades.  ? The items listed may not be a complete list. Talk with your dietitian about what dietary choices are right for you.  Summary   The Mediterranean diet includes both food and lifestyle choices.   Eat a variety of fresh fruits and vegetables, beans, nuts, seeds, and whole grains.   Limit the amount of red meat and sweets that you eat.   Talk with your health care provider about whether it is safe for you to drink red wine in moderation. This means 1 glass a day for nonpregnant women and 2 glasses a day for men. A glass of wine equals 5 oz (150 mL).  This information  is not intended to replace advice given to you by your health care provider. Make sure you discuss any questions you have with your health care provider.  Document Released: 10/15/2015 Document Revised: 11/17/2015 Document Reviewed: 10/15/2015  Elsevier Interactive Patient Education  2019 Elsevier Inc[employer]

## 2018-03-16 NOTE — Progress Notes (Signed)
Date:  03/16/2018   Name:  Shelly Ward   DOB:  1965-09-15   MRN:  [id number]   Chief Complaint: Anxiety (GAD7=4)  Anxiety  Presents for follow-up visit. Symptoms include excessive worry and nervous/anxious behavior. Patient reports no chest pain, confusion, decreased concentration, depressed mood, dizziness, feeling of choking, nausea, panic or shortness of breath. Symptoms occur occasionally. The severity of symptoms is mild. Nighttime awakenings: occasional.      Review of Systems  Constitutional: Negative.  Negative for chills, fatigue, fever and unexpected weight change.  HENT: Negative for congestion, ear discharge, ear pain, rhinorrhea, sinus pressure, sneezing and sore throat.   Eyes: Negative for photophobia, pain, discharge, redness and itching.  Respiratory: Negative for cough, shortness of breath, wheezing and stridor.   Cardiovascular: Negative for chest pain.  Gastrointestinal: Negative for abdominal pain, blood in stool, constipation, diarrhea, nausea and vomiting.  Endocrine: Negative for cold intolerance, heat intolerance, polydipsia, polyphagia and polyuria.  Genitourinary: Negative for dysuria, flank pain, frequency, hematuria, menstrual problem, pelvic pain, urgency, vaginal bleeding and vaginal discharge.  Musculoskeletal: Negative for arthralgias, back pain and myalgias.  Skin: Negative for rash.  Allergic/Immunologic: Negative for environmental allergies and food allergies.  Neurological: Negative for dizziness, weakness, light-headedness, numbness and headaches.  Hematological: Negative for adenopathy. Does not bruise/bleed easily.  Psychiatric/Behavioral: Negative for confusion, decreased concentration and dysphoric mood. The patient is nervous/anxious.     Patient Active Problem List   Diagnosis Date Noted  . Breast mass, right   . Depression 01/23/2017  . 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 Scoliosis 01/23/2017  . Anxiety 08/21/2014     No Known Allergies  Past Surgical History:  Procedure Laterality Date  . BREAST BIOPSY Right 04/06/2017   Procedure: BREAST BIOPSY WITH NEEDLE LOCALIZATION;  Surgeon: Clayburn Pert, MD;  Location: [facility] ORS;  Service: General;  Laterality: Right;  . BREAST LUMPECTOMY Right 2019   intraductual papilloma  . TONSILLECTOMY    . WISDOM TOOTH EXTRACTION      Social History   Tobacco Use  . Smoking status: Current Every Day Smoker    Packs/day: 0.50    Years: 15.00    Pack years: 7.50    Types: Cigarettes  . Smokeless tobacco: Never Used  . Tobacco comment: 1/2 pack/ day  Substance Use Topics  . Alcohol use: No  . Drug use: No     Medication list has been reviewed and updated.  Current Meds  Medication Sig  . Aspirin-Salicylamide-Caffeine (BC HEADACHE POWDER PO) Take 1 tablet by mouth as needed.  Marland Kitchen FLUoxetine (PROZAC) 10 MG tablet Take 1 tablet (10 mg total) by mouth at bedtime.  . fluticasone (FLONASE) 50 MCG/ACT nasal spray Place 1 spray into both nostrils daily as needed for allergies or rhinitis.  Marland Kitchen ibuprofen (ADVIL,MOTRIN) 200 MG tablet Take 600 mg by mouth every 8 (eight) hours as needed for mild pain.  . Multiple Vitamin (MULTI-VITAMINS) TABS Take 1 tablet by mouth daily.  . [DISCONTINUED] FLUoxetine (PROZAC) 10 MG tablet Take 1 tablet (10 mg total) by mouth at bedtime.    No flowsheet data found.  Physical Exam Vitals signs and nursing note reviewed.  Constitutional:      General: She is not in acute distress.    Appearance: She is not diaphoretic.  HENT:     Head: Normocephalic and atraumatic.     Right Ear: External ear normal.     Left Ear: External ear normal.  Nose: Nose normal.  Eyes:     General:        Right eye: No discharge.        Left eye: No discharge.     Conjunctiva/sclera: Conjunctivae normal.     Pupils: Pupils are equal, round, and reactive to light.  Neck:     Musculoskeletal: Normal range of motion and neck supple.      Thyroid: No thyromegaly.     Vascular: No JVD.  Cardiovascular:     Rate and Rhythm: Normal rate and regular rhythm.     Heart sounds: Normal heart sounds. No murmur. No friction rub. No gallop.   Pulmonary:     Effort: Pulmonary effort is normal.     Breath sounds: Normal breath sounds.  Abdominal:     General: Bowel sounds are normal.     Palpations: Abdomen is soft. There is no mass.     Tenderness: There is no abdominal tenderness. There is no guarding.  Musculoskeletal: Normal range of motion.  Lymphadenopathy:     Cervical: No cervical adenopathy.  Skin:    General: Skin is warm and dry.  Neurological:     Mental Status: She is alert.     Deep Tendon Reflexes: Reflexes are normal and symmetric.     BP 110/80   Pulse 80   Ht 5' 1.5" (1.562 m)   Wt 147 lb (66.7 kg)   BMI 27.33 kg/m   Assessment and Plan:  1. Anxiety Chronic.  Persistent.  Ultimately controlled.  On Prozac 10 mg once a day at bedtime - FLUoxetine (PROZAC) 10 MG tablet; Take 1 tablet (10 mg total) by mouth at bedtime.  Dispense: 90 tablet; Refill: 1

## 2018-03-21 ENCOUNTER — Ambulatory Visit: Payer: Self-pay | Admitting: Surgery

## 2018-03-23 ENCOUNTER — Ambulatory Visit (INDEPENDENT_AMBULATORY_CARE_PROVIDER_SITE_OTHER): Payer: No Typology Code available for payment source | Admitting: Surgery

## 2018-03-23 ENCOUNTER — Other Ambulatory Visit: Payer: Self-pay

## 2018-03-23 ENCOUNTER — Encounter: Payer: Self-pay | Admitting: Surgery

## 2018-03-23 VITALS — BP 134/70 | HR 74 | Temp 97.7°F | Ht 62.0 in | Wt 144.0 lb

## 2018-03-23 DIAGNOSIS — D241 Benign neoplasm of right breast: Secondary | ICD-10-CM

## 2018-03-23 NOTE — Progress Notes (Signed)
03/23/2018  History of Present Illness: Shelly Ward is a 53 y.o. 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  She presents for follow up.  She reports doing well, without any pain issues.  Denies any new masses, skin changes, nipple drainage, or other concerns.  She had her mammogram on 03/12/18, which did not show any evidence of malignancy.  Past Medical History: Past Medical History:  Diagnosis Date  . Acute anxiety 08/21/2014  . Anxiety   . Arthritis    hip  . Depression 01/23/2017  . 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 GERD (gastroesophageal reflux disease)    occ-no meds  . 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 Scoliosis 01/23/2017     Past Surgical History: Past Surgical History:  Procedure Laterality Date  . BREAST BIOPSY Right 04/06/2017   Procedure: BREAST BIOPSY WITH NEEDLE LOCALIZATION;  Surgeon: Clayburn Pert, MD;  Location: [facility] ORS;  Service: General;  Laterality: Right;  . BREAST LUMPECTOMY Right 2019   intraductual papilloma  . TONSILLECTOMY    . WISDOM TOOTH EXTRACTION      Home Medications: Prior to Admission medications   Medication Sig Start Date End Date Taking? Authorizing Provider  Aspirin-Salicylamide-Caffeine (BC HEADACHE POWDER PO) Take 1 tablet by mouth as needed.   Yes [provider]  FLUoxetine (PROZAC) 10 MG tablet Take 1 tablet (10 mg total) by mouth at bedtime. 03/16/18  Yes Juline Patch, MD  fluticasone (FLONASE) 50 MCG/ACT nasal spray Place 1 spray into both nostrils daily as needed for allergies or rhinitis.   Yes [provider]  ibuprofen (ADVIL,MOTRIN) 200 MG tablet Take 600 mg by mouth every 8 (eight) hours as needed for mild pain.   Yes [provider]  Multiple Vitamin (MULTI-VITAMINS) TABS Take 1 tablet by mouth daily.   Yes [provider]    Allergies: No Known Allergies  Review of Systems: Review of Systems  Constitutional: Negative for  chills and fever.  Respiratory: Negative for shortness of breath.   Cardiovascular: Negative for chest pain.  Gastrointestinal: Negative for abdominal pain, nausea and vomiting.  Skin: Negative for rash.    Physical Exam BP 134/70   Pulse 74   Temp 97.7 F (36.5 C) (Skin)   Ht 5\' 2"  (1.575 m)   Wt 144 lb (65.3 kg)   SpO2 98%   BMI 26.34 kg/m  CONSTITUTIONAL: No acute distress HEENT:  Normocephalic, atraumatic, extraocular motion intact. RESPIRATORY:  Lungs are clear, and breath sounds are equal bilaterally. Normal respiratory effort without pathologic use of accessory muscles. CARDIOVASCULAR: Heart is regular without murmurs, gallops, or rubs. BREAST:  Right breast scar well healed without any palpable masses, skin changes, or nipple drainage.  No lymphadenopathy.  Left breast and axilla unremarkable. GI: The abdomen is soft, non-distended, non-tender.  NEUROLOGIC:  Motor and sensation is grossly normal.  Cranial nerves are grossly intact. PSYCH:  Alert and oriented to person, place and time. Affect is normal.  Labs/Imaging: Mammogram 03/12/18: FINDINGS: There are no findings suspicious for malignancy. Images were processed with CAD.  IMPRESSION: No mammographic evidence of malignancy. A result letter of this screening mammogram will be mailed directly to the patient.  RECOMMENDATION: Screening mammogram in one year.   Assessment and Plan: This is a 53 y.o. female s/p right breast lumpectomy in 04/06/17  Discussed with patient that her mammogram results were negative for any suspicious findings or malignancy.  Her exam was  unremarkable today.  She may follow up in a year with a repeat bilateral screening mammogram.  Face-to-face time spent with the patient and care providers was 15 minutes, with more than 50% of the time spent counseling, educating, and coordinating care of the patient.     Melvyn Neth, [facility]

## 2018-03-23 NOTE — Patient Instructions (Signed)
Patient will be asked to return to the office in one year with a bilateral screening mammogram. The patient is aware to call back for any questions or concerns. 

## 2019-02-05 ENCOUNTER — Other Ambulatory Visit: Payer: Self-pay

## 2019-02-05 DIAGNOSIS — Z20822 Contact with and (suspected) exposure to covid-19: Secondary | ICD-10-CM

## 2019-02-07 ENCOUNTER — Telehealth: Payer: Self-pay | Admitting: General Practice

## 2019-02-07 LAB — NOVEL CORONAVIRUS, NAA: SARS-CoV-2, NAA: NOT DETECTED

## 2019-02-07 NOTE — Telephone Encounter (Signed)
Negative COVID results given. Patient results "NOT Detected." Caller expressed understanding. ° °

## 2019-02-12 ENCOUNTER — Encounter: Payer: Self-pay | Admitting: Emergency Medicine

## 2019-02-12 ENCOUNTER — Ambulatory Visit
Admission: EM | Admit: 2019-02-12 | Discharge: 2019-02-12 | Disposition: A | Discharge status: Home / Self Care | Payer: Self-pay | Attending: Family Medicine | Admitting: Family Medicine

## 2019-02-12 ENCOUNTER — Other Ambulatory Visit: Payer: Self-pay

## 2019-02-12 DIAGNOSIS — M1631 Unilateral osteoarthritis resulting from hip dysplasia, right hip: Secondary | ICD-10-CM

## 2019-02-12 MED ORDER — PREDNISONE 10 MG PO TABS: ORAL_TABLET | ORAL | 0 refills | Status: DC

## 2019-02-12 MED ORDER — HYDROCODONE-ACETAMINOPHEN 5-325 MG PO TABS: 1.0000 | ORAL_TABLET | Freq: Three times a day (TID) | ORAL | 0 refills | Status: DC | PRN

## 2019-02-12 NOTE — ED Triage Notes (Signed)
Pt states that she has right "leg pain" she has been seeing [facility] for bursitis in her right hip but could not get in with until January. Pt states that pain has been worse for the last 3 days.

## 2019-02-12 NOTE — ED Provider Notes (Signed)
[facility]    CSN: [id number] Arrival date & time: 02/12/19  1202  History   Chief Complaint Chief Complaint  Patient presents with  . Hip Pain    right   HPI  53 year old female presents with right hip pain.  Patient has chronic right hip pain due to right hip dysplasia and subsequent arthritis.  Patient is currently followed by orthopedics.  She has had a steroid injection for trochanteric bursitis in July.  Patient reports that she continues to have severe hip pain.  Decreased range of motion.  Difficulty ambulating.  Patient states that she walks with a limp.  Patient reports that her pain has been worse over the past 3 days.  Has not been relieved by daily meloxicam.  Exacerbated by activity and seems to be worse first thing in the morning.  Patient has called orthopedics and cannot be seen until January.  No radicular symptoms.  No other associated symptoms.  No other complaints.  PMH, Surgical Hx, Family Hx, Social History reviewed and updated as below.  Past Medical History:  Diagnosis Date  . Acute anxiety 08/21/2014  . Anxiety   . Arthritis    hip  . Depression 01/23/2017  . 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 GERD (gastroesophageal reflux disease)    occ-no meds  . 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 Scoliosis 01/23/2017    Patient Active Problem List   Diagnosis Date Noted  . Breast mass, right   . Depression 01/23/2017  . 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 Scoliosis 01/23/2017  . Anxiety 08/21/2014    Past Surgical History:  Procedure Laterality Date  . BREAST BIOPSY Right 04/06/2017   Procedure: BREAST BIOPSY WITH NEEDLE LOCALIZATION;  Surgeon: Clayburn Pert, MD;  Location: [facility] ORS;  Service: General;  Laterality: Right;  . BREAST LUMPECTOMY Right 2019   intraductual papilloma  . TONSILLECTOMY    . WISDOM TOOTH EXTRACTION      OB History   No obstetric history on file.      Home Medications    Prior to  Admission medications   Medication Sig Start Date End Date Taking? Authorizing Provider  Aspirin-Salicylamide-Caffeine (BC HEADACHE POWDER PO) Take 1 tablet by mouth as needed.   Yes [provider]  FLUoxetine (PROZAC) 10 MG tablet Take 1 tablet (10 mg total) by mouth at bedtime. 03/16/18  Yes Juline Patch, MD  Multiple Vitamin (MULTI-VITAMINS) TABS Take 1 tablet by mouth daily.   Yes [provider]  HYDROcodone-acetaminophen (NORCO/VICODIN) 5-325 MG tablet Take 1 tablet by mouth every 8 (eight) hours as needed for moderate pain. 02/12/19   Coral Spikes, DO  predniSONE (DELTASONE) 10 MG tablet 50 mg daily x 2 days, then 40 mg daily x 2 days, then 30 mg daily x 2 days, then 20 mg daily x 2 days, then 10 mg daily x 2 days. 02/12/19   Coral Spikes, DO  fluticasone (FLONASE) 50 MCG/ACT nasal spray Place 1 spray into both nostrils daily as needed for allergies or rhinitis.  02/12/19  [provider]    Family History Family History  Problem Relation Age of Onset  . Arthritis Mother   . Diabetes Father     Social History Social History   Tobacco Use  . Smoking status: Current Every Day Smoker    Packs/day: 0.50    Years: 15.00    Pack years: 7.50  Types: Cigarettes  . Smokeless tobacco: Never Used  . Tobacco comment: 1/2 pack/ day  Substance Use Topics  . Alcohol use: No  . Drug use: No     Allergies   Patient has no known allergies.   Review of Systems Review of Systems  Constitutional: Negative.   Musculoskeletal:       Hip pain.   Physical Exam Triage Vital Signs ED Triage Vitals  Enc Vitals Group     BP 02/12/19 1224 128/79     Pulse Rate 02/12/19 1224 85     Resp 02/12/19 1224 18     Temp 02/12/19 1224 98.5 F (36.9 C)     Temp Source 02/12/19 1224 Oral     SpO2 02/12/19 1224 98 %     Weight 02/12/19 1220 135 lb (61.2 kg)     Height 02/12/19 1220 5\' 3"  (1.6 m)     Head Circumference --      Peak Flow --      Pain Score 02/12/19  1219 5     Pain Loc --      Pain Edu? --      Excl. in [place]? --    Updated Vital Signs BP 128/79 (BP Location: Right Arm)   Pulse 85   Temp 98.5 F (36.9 C) (Oral)   Resp 18   Ht 5\' 3"  (1.6 m)   Wt 61.2 kg   SpO2 98%   BMI 23.91 kg/m   Visual Acuity Right Eye Distance:   Left Eye Distance:   Bilateral Distance:    Right Eye Near:   Left Eye Near:    Bilateral Near:     Physical Exam Vitals signs and nursing note reviewed.  Constitutional:      General: She is not in acute distress.    Appearance: Normal appearance. She is not ill-appearing.  HENT:     Head: Normocephalic and atraumatic.  Eyes:     General:        Right eye: No discharge.        Left eye: No discharge.     Conjunctiva/sclera: Conjunctivae normal.  Cardiovascular:     Rate and Rhythm: Normal rate and regular rhythm.     Heart sounds: No murmur.  Pulmonary:     Effort: Pulmonary effort is normal. No respiratory distress.  Musculoskeletal:     Comments: Right hip -markedly decreased range of motion in internal and external rotation.  No tenderness over the trochanter.  Skin:    General: Skin is warm.     Findings: No rash.  Neurological:     Mental Status: She is alert.  Psychiatric:        Behavior: Behavior normal.    UC Treatments / Results  Labs (all labs ordered are listed, but only abnormal results are displayed) Labs Reviewed - No data to display  EKG   Radiology No results found.  Procedures Procedures (including critical care time)  Medications Ordered in UC Medications - No data to display  Initial Impression / Assessment and Plan / UC Course  I have reviewed the triage vital signs and the nursing notes.  Pertinent labs & imaging results that were available during my care of the patient were reviewed by me and considered in my medical decision making (see chart for details).    53 year old female presents with chronic right hip pain.  Worsening.  Need to see  orthopedics.  Needs consideration for joint replacement.  Treating acutely with  prednisone and Vicodin.  [place] controlled substance database reviewed.  No concerns for abuse.  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  See Ortho.  Take care  Dr. Lacinda Axon    ED Prescriptions    Medication Sig Dispense Auth. Provider   predniSONE (DELTASONE) 10 MG tablet 50 mg daily x 2 days, then 40 mg daily x 2 days, then 30 mg daily x 2 days, then 20 mg daily x 2 days, then 10 mg daily x 2 days. 30 tablet Evlyn Amason G, DO   HYDROcodone-acetaminophen (NORCO/VICODIN) 5-325 MG tablet Take 1 tablet by mouth every 8 (eight) hours as needed for moderate pain. 15 tablet Thersa Salt G, DO     I have reviewed the PDMP during this encounter.   Coral Spikes, [place] 02/12/19 1321

## 2019-02-12 NOTE — Discharge Instructions (Signed)
Medication as prescribed.  See Ortho.  Take care  Dr. Lacinda Axon

## 2019-03-13 ENCOUNTER — Ambulatory Visit: Payer: Self-pay

## 2019-03-15 ENCOUNTER — Ambulatory Visit: Payer: Self-pay | Admitting: Surgery

## 2019-04-03 ENCOUNTER — Ambulatory Visit: Payer: Self-pay

## 2019-04-05 ENCOUNTER — Telehealth: Payer: Self-pay

## 2019-04-07 NOTE — Progress Notes (Signed)
Patient pre-screened for BCCCP eligibility due to COVID 19 precautions. Two patient identifiers used for verification that I was speaking to correct patient.  Presented to [facility] 04/09/19 at 10:30 for BCCCP screening mammogram.

## 2019-04-08 ENCOUNTER — Ambulatory Visit: Payer: Self-pay | Admitting: Surgery

## 2019-04-09 ENCOUNTER — Ambulatory Visit: Payer: Self-pay | Attending: Oncology | Admitting: *Deleted

## 2019-04-09 ENCOUNTER — Encounter: Payer: Self-pay | Admitting: *Deleted

## 2019-04-09 ENCOUNTER — Ambulatory Visit
Admission: RE | Admit: 2019-04-09 | Discharge: 2019-04-09 | Disposition: A | Discharge status: Home / Self Care | Payer: Self-pay | Source: Ambulatory Visit | Attending: Oncology | Admitting: Oncology

## 2019-04-09 DIAGNOSIS — Z0000 Encounter for general adult medical examination without abnormal findings: Secondary | ICD-10-CM | POA: Insufficient documentation

## 2019-04-09 NOTE — Progress Notes (Signed)
  Subjective:     Patient ID: Shelly Ward, female   DOB: Sep 19, 1965, 54 y.o.   MRN: [id number]  HPI   Review of Systems     Objective:   Physical Exam     Assessment:     Due to Covid 19 pandemic a televisit was used to enroll patient into our BCCCP program and obtain her health history.  Patient denies any breast problems.  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  Last pap on 06/17/15 was negative /negative.  Next pap is due in 2022.Patient has been screened for eligibility.  She does not have any insurance, Medicare or Medicaid.  She also meets financial eligibility.  See [facility] model breast cancer risk assessment below. Risk Assessment    Risk Scores      04/09/2019 03/12/2018   Last edited by: Theodore Demark, RN [place], Laddie Aquas, CMA   5-year risk: 1.5 % 1.4 %   Lifetime risk: 12.5 % 12.7 %            Plan:     Screening mammogram ordered.  Will follow up per BCCCP protocol.

## 2019-04-10 ENCOUNTER — Encounter: Payer: Self-pay | Admitting: *Deleted

## 2019-04-10 NOTE — Progress Notes (Signed)
Letter mailed from [facility] to inform patient of her normal mammogram results.  Patient is to follow-up with annual screening in one year. 

## 2019-05-29 ENCOUNTER — Encounter: Payer: Self-pay | Admitting: *Deleted

## 2019-06-11 ENCOUNTER — Encounter: Payer: Self-pay | Admitting: *Deleted

## 2019-06-22 ENCOUNTER — Other Ambulatory Visit: Payer: Self-pay

## 2019-06-22 ENCOUNTER — Encounter: Payer: Self-pay | Admitting: Emergency Medicine

## 2019-06-22 ENCOUNTER — Ambulatory Visit
Admission: EM | Admit: 2019-06-22 | Discharge: 2019-06-22 | Disposition: A | Discharge status: Home / Self Care | Payer: BLUE CROSS/BLUE SHIELD | Attending: Family Medicine | Admitting: Family Medicine

## 2019-06-22 DIAGNOSIS — M1631 Unilateral osteoarthritis resulting from hip dysplasia, right hip: Secondary | ICD-10-CM | POA: Diagnosis not present

## 2019-06-22 MED ORDER — KETOROLAC TROMETHAMINE 10 MG PO TABS: 10.0000 mg | ORAL_TABLET | Freq: Four times a day (QID) | ORAL | 0 refills | Status: DC | PRN

## 2019-06-22 NOTE — Discharge Instructions (Signed)
Medication as directed.  Use the Vicodin as needed for severe pain.  Please call [facility] [phone]) OR [facility] [phone]) for an appt.  Take care  Dr. Lacinda Axon

## 2019-06-22 NOTE — ED Provider Notes (Signed)
[facility]    CSN: [id number] Arrival date & time: 06/22/19  1402      History   Chief Complaint Chief Complaint  Patient presents with  . Leg Pain    right   HPI  54 year old female presents with right hip pain.  Patient has chronic hip pain.  She has known hip dysplasia which is led to severe osteoarthritis of the hip.  Patient is in need of hip replacement.  Patient reports worsening pain.  She has had a recent corticosteroid injection but it has not helped significantly.  She has an upcoming appointment with orthopedics.  Patient is under a great deal of pain.  Worse when she ambulates.  Pain 7/10 in severity.  No relieving factors.  No other complaints.  Past Medical History:  Diagnosis Date  . Acute anxiety 08/21/2014  . Anxiety   . Arthritis    hip  . Depression 01/23/2017  . 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 GERD (gastroesophageal reflux disease)    occ-no meds  . 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 Scoliosis 01/23/2017    Patient Active Problem List   Diagnosis Date Noted  . Breast mass, right   . Depression 01/23/2017  . 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 Scoliosis 01/23/2017  . Anxiety 08/21/2014    Past Surgical History:  Procedure Laterality Date  . BREAST BIOPSY Right 04/06/2017   Procedure: BREAST BIOPSY WITH NEEDLE LOCALIZATION;  Surgeon: Clayburn Pert, MD;  Location: [facility] ORS;  Service: General;  Laterality: Right;  . BREAST EXCISIONAL BIOPSY    . BREAST LUMPECTOMY Right 2019   intraductual papilloma  . TONSILLECTOMY    . WISDOM TOOTH EXTRACTION      OB History   No obstetric history on file.      Home Medications    Prior to Admission medications   Medication Sig Start Date End Date Taking? Authorizing Provider  FLUoxetine (PROZAC) 10 MG tablet Take 1 tablet (10 mg total) by mouth at bedtime. 03/16/18  Yes Juline Patch, MD  Multiple Vitamin (MULTI-VITAMINS) TABS Take 1 tablet by mouth daily.    Yes [provider]  HYDROcodone-acetaminophen (NORCO/VICODIN) 5-325 MG tablet Take 1 tablet by mouth every 8 (eight) hours as needed for moderate pain. 02/12/19   Coral Spikes, DO  ketorolac (TORADOL) 10 MG tablet Take 1 tablet (10 mg total) by mouth every 6 (six) hours as needed for moderate pain or severe pain. 06/22/19   Coral Spikes, DO  fluticasone (FLONASE) 50 MCG/ACT nasal spray Place 1 spray into both nostrils daily as needed for allergies or rhinitis.  02/12/19  [provider]    Family History Family History  Problem Relation Age of Onset  . Arthritis Mother   . Diabetes Father     Social History Social History   Tobacco Use  . Smoking status: Current Every Day Smoker    Packs/day: 0.50    Years: 15.00    Pack years: 7.50    Types: Cigarettes  . Smokeless tobacco: Never Used  . Tobacco comment: 1/2 pack/ day  Substance Use Topics  . Alcohol use: No  . Drug use: No     Allergies   Patient has no known allergies.   Review of Systems Review of Systems  Musculoskeletal:       Right hip pain.   Physical Exam Triage Vital Signs ED Triage Vitals [06/22/19 1422]  Enc Vitals Group  BP (!) 141/86     Pulse Rate 90     Resp 14     Temp 98.2 F (36.8 C)     Temp Source Oral     SpO2 99 %     Weight 125 lb (56.7 kg)     Height 5\' 2"  (1.575 m)     Head Circumference      Peak Flow      Pain Score 7     Pain Loc      Pain Edu?      Excl. in [place]?    Updated Vital Signs BP (!) 141/86 (BP Location: Left Arm)   Pulse 90   Temp 98.2 F (36.8 C) (Oral)   Resp 14   Ht 5\' 2"  (1.575 m)   Wt 56.7 kg   LMP 09/27/2016 (Approximate)   SpO2 99%   BMI 22.86 kg/m   Visual Acuity Right Eye Distance:   Left Eye Distance:   Bilateral Distance:    Right Eye Near:   Left Eye Near:    Bilateral Near:     Physical Exam Vitals and nursing note reviewed.  Constitutional:      General: She is not in acute distress.    Appearance: Normal  appearance. She is not ill-appearing.  HENT:     Head: Normocephalic and atraumatic.  Eyes:     General:        Right eye: No discharge.        Left eye: No discharge.     Conjunctiva/sclera: Conjunctivae normal.  Pulmonary:     Effort: Pulmonary effort is normal. No respiratory distress.  Neurological:     Mental Status: She is alert.     Gait: Gait abnormal.  Psychiatric:        Mood and Affect: Mood normal.        Behavior: Behavior normal.    UC Treatments / Results  Labs (all labs ordered are listed, but only abnormal results are displayed) Labs Reviewed - No data to display  EKG   Radiology No results found.  Procedures Procedures (including critical care time)  Medications Ordered in UC Medications - No data to display  Initial Impression / Assessment and Plan / UC Course  I have reviewed the triage vital signs and the nursing notes.  Pertinent labs & imaging results that were available during my care of the patient were reviewed by me and considered in my medical decision making (see chart for details).    54 year old female presents with chronic hip pain as a result of osteoarthritis secondary to hip dysplasia.  Patient needs hip replacement.  Information given regarding alternative orthopedist if desired for sooner appointment.  Ketorolac as needed for pain.  Patient may used previously prescribed Vicodin for severe pain.  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  Use the Vicodin as needed for severe pain.  Please call [facility] [phone]) OR [facility] [phone]) for an appt.  Take care  Dr. Lacinda Axon    ED Prescriptions    Medication Sig Dispense Auth. Provider   ketorolac (TORADOL) 10 MG tablet Take 1 tablet (10 mg total) by mouth every 6 (six) hours as needed for moderate pain or severe  pain. 20 tablet Thersa Salt G, DO     I have reviewed the PDMP during this encounter.  Coral Spikes, [place] 06/22/19 1544

## 2019-06-22 NOTE — ED Triage Notes (Signed)
Patient states that she has right hip problems and is needing a hip replacement.  Patient states that she has been having ongoing pain in her right upper leg for the past couple of days.

## 2019-07-08 ENCOUNTER — Encounter: Payer: Self-pay | Admitting: Surgery

## 2019-07-08 ENCOUNTER — Ambulatory Visit (INDEPENDENT_AMBULATORY_CARE_PROVIDER_SITE_OTHER): Payer: BLUE CROSS/BLUE SHIELD | Admitting: Surgery

## 2019-07-08 ENCOUNTER — Other Ambulatory Visit: Payer: Self-pay

## 2019-07-08 VITALS — BP 138/85 | HR 82 | Temp 97.7°F | Resp 12 | Wt 122.4 lb

## 2019-07-08 DIAGNOSIS — D241 Benign neoplasm of right breast: Secondary | ICD-10-CM

## 2019-07-08 NOTE — Patient Instructions (Addendum)
Continue to get your yearly mammograms with the BCCCP Program.  Follow up as needed, call the office if you have any questions or concerns.   Breast Self-Awareness Breast self-awareness is knowing how your breasts look and feel. Doing breast self-awareness is important. It allows you to catch a breast problem early while it is still small and can be treated. All women should do breast self-awareness, including women who have had breast implants. Tell your doctor if you notice a change in your breasts. What you need:  A mirror.  A well-lit room. How to do a breast self-exam A breast self-exam is one way to learn what is normal for your breasts and to check for changes. To do a breast self-exam: Look for changes  1. Take off all the clothes above your waist. 2. Stand in front of a mirror in a room with good lighting. 3. Put your hands on your hips. 4. Push your hands down. 5. Look at your breasts and nipples in the mirror to see if one breast or nipple looks different from the other. Check to see if: ? The shape of one breast is different. ? The size of one breast is different. ? There are wrinkles, dips, and bumps in one breast and not the other. 6. Look at each breast for changes in the skin, such as: ? Redness. ? Scaly areas. 7. Look for changes in your nipples, such as: ? Liquid around the nipples. ? Bleeding. ? Dimpling. ? Redness. ? A change in where the nipples are. Feel for changes  1. Lie on your back on the floor. 2. Feel each breast. To do this, follow these steps: ? Pick a breast to feel. ? Put the arm closest to that breast above your head. ? Use your other arm to feel the nipple area of your breast. Feel the area with the pads of your three middle fingers by making small circles with your fingers. For the first circle, press lightly. For the second circle, press harder. For the third circle, press even harder. ? Keep making circles with your fingers at the different  pressures as you move down your breast. Stop when you feel your ribs. ? Move your fingers a little toward the center of your body. ? Start making circles with your fingers again, this time going up until you reach your collarbone. ? Keep making up-and-down circles until you reach your armpit. Remember to keep using the three pressures. ? Feel the other breast in the same way. 3. Sit or stand in the tub or shower. 4. With soapy water on your skin, feel each breast the same way you did in step 2 when you were lying on the floor. Write down what you find Writing down what you find can help you remember what to tell your doctor. Write down:  What is normal for each breast.  Any changes you find in each breast, including: ? The kind of changes you find. ? Whether you have pain. ? Size and location of any lumps.  When you last had your menstrual period. General tips  Check your breasts every month.  If you are breastfeeding, the best time to check your breasts is after you feed your baby or after you use a breast pump.  If you get menstrual periods, the best time to check your breasts is 5-7 days after your menstrual period is over.  With time, you will become comfortable with the self-exam, and you will begin  to know if there are changes in your breasts. Contact a doctor if you:  See a change in the shape or size of your breasts or nipples.  See a change in the skin of your breast or nipples, such as red or scaly skin.  Have fluid coming from your nipples that is not normal.  Find a lump or thick area that was not there before.  Have pain in your breasts.  Have any concerns about your breast health. Summary  Breast self-awareness includes looking for changes in your breasts, as well as feeling for changes within your breasts.  Breast self-awareness should be done in front of a mirror in a well-lit room.  You should check your breasts every month. If you get menstrual periods,  the best time to check your breasts is 5-7 days after your menstrual period is over.  Let your doctor know of any changes you see in your breasts, including changes in size, changes on the skin, pain or tenderness, or fluid from your nipples that is not normal. This information is not intended to replace advice given to you by your health care provider. Make sure you discuss any questions you have with your health care provider. Document Revised: 10/10/2017 Document Reviewed: 10/10/2017 Elsevier Patient Education  [place].

## 2019-07-08 NOTE — Progress Notes (Signed)
07/08/2019  History of Present Illness: Shelly Ward is a 54 y.o. female s/p right breast lumpectomy for intraductal papilloma on 04/06/17 by Dr. Adonis Huguenin.  She presents today for follow up.  She reports she's doing well.  She denies any lumps/bumps, skin changes, or nipple changes/drainage.  However, she reports she's been having issues with her right hip and has been told she will need a hip replacement.  Past Medical History: Past Medical History:  Diagnosis Date  . Acute anxiety 08/21/2014  . Anxiety   . Arthritis    hip  . Depression 01/23/2017  . 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 GERD (gastroesophageal reflux disease)    occ-no meds  . 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 Scoliosis 01/23/2017     Past Surgical History: Past Surgical History:  Procedure Laterality Date  . BREAST BIOPSY Right 04/06/2017   Procedure: BREAST BIOPSY WITH NEEDLE LOCALIZATION;  Surgeon: Clayburn Pert, MD;  Location: [facility] ORS;  Service: General;  Laterality: Right;  . BREAST EXCISIONAL BIOPSY    . BREAST LUMPECTOMY Right 2019   intraductual papilloma  . TONSILLECTOMY    . WISDOM TOOTH EXTRACTION      Home Medications: Prior to Admission medications   Medication Sig Start Date End Date Taking? Authorizing Provider  FLUoxetine (PROZAC) 10 MG tablet Take 1 tablet (10 mg total) by mouth at bedtime. 03/16/18  Yes Juline Patch, MD  HYDROcodone-acetaminophen (NORCO/VICODIN) 5-325 MG tablet Take 1 tablet by mouth every 8 (eight) hours as needed for moderate pain. 02/12/19  Yes Cook, Barnie Del, DO  Multiple Vitamin (MULTI-VITAMINS) TABS Take 1 tablet by mouth daily.   Yes [provider]  fluticasone (FLONASE) 50 MCG/ACT nasal spray Place 1 spray into both nostrils daily as needed for allergies or rhinitis.  02/12/19  [provider]    Allergies: No Known Allergies  Review of Systems: Review of Systems  Constitutional: Negative for chills and fever.   Respiratory: Negative for shortness of breath.   Cardiovascular: Negative for chest pain.  Gastrointestinal: Negative for nausea and vomiting.  Skin: Negative for rash.    Physical Exam BP 138/85   Pulse 82   Temp 97.7 F (36.5 C)   Resp 12   Wt 122 lb 6.4 oz (55.5 kg)   LMP 09/27/2016 (Approximate)   SpO2 96%   BMI 22.39 kg/m  CONSTITUTIONAL: No acute distress HEENT:  Normocephalic, atraumatic, extraocular motion intact. RESPIRATORY:  Lungs are clear, and breath sounds are equal bilaterally. Normal respiratory effort without pathologic use of accessory muscles. CARDIOVASCULAR: Heart is regular without murmurs, gallops, or rubs. BREAST:   NEUROLOGIC:  Motor and sensation is grossly normal.  Cranial nerves are grossly intact. PSYCH:  Alert and oriented to person, place and time. Affect is normal.  Labs/Imaging: Mammogram 04/09/19: FINDINGS: There are no findings suspicious for malignancy. Images were processed with CAD.  IMPRESSION: No mammographic evidence of malignancy. A result letter of this screening mammogram will be mailed directly to the patient.  RECOMMENDATION: Screening mammogram in one year. (Code:SM-B-01Y)  BI-RADS CATEGORY  1: Negative.  Assessment and Plan: This is a 54 y.o. female s/p right breast lumpectomy for intraductal papilloma  --Reviewed mammogram with patient.  There are no suspicious findings on either breast.  Her exam today is reassuring as well. --Follow up with [place] as needed.  Yearly screening mammograms will be deferred to her PCP / BCCCP program.  Patient is aware that she  can call us anytime if she has any concerns or new findings.   Face-to-face time spent with the patient and care providers was 15 minutes, with more than 50% of the time spent counseling, educating, and coordinating care of the patient.     Melvyn Neth, [facility]

## 2019-09-11 ENCOUNTER — Encounter: Payer: Self-pay | Admitting: Family Medicine

## 2019-09-11 ENCOUNTER — Other Ambulatory Visit: Payer: Self-pay

## 2019-09-11 ENCOUNTER — Ambulatory Visit (INDEPENDENT_AMBULATORY_CARE_PROVIDER_SITE_OTHER): Payer: 59 | Admitting: Family Medicine

## 2019-09-11 NOTE — Patient Instructions (Signed)
Pt was not seen due to primary care is [facility]- called and got her an appt on Monday 12th @ 8:00 with Dr Kym Groom for surgery clearance

## 2020-03-07 HISTORY — PX: TOTAL HIP ARTHROPLASTY: SHX124

## 2020-05-21 ENCOUNTER — Other Ambulatory Visit: Payer: Self-pay | Admitting: Family Medicine

## 2020-05-21 DIAGNOSIS — R1084 Generalized abdominal pain: Secondary | ICD-10-CM

## 2020-05-21 DIAGNOSIS — R140 Abdominal distension (gaseous): Secondary | ICD-10-CM

## 2020-05-22 ENCOUNTER — Other Ambulatory Visit: Payer: Self-pay | Admitting: Family Medicine

## 2020-05-22 ENCOUNTER — Ambulatory Visit: Admission: RE | Admit: 2020-05-22 | Payer: 59 | Source: Ambulatory Visit

## 2020-05-22 DIAGNOSIS — R1084 Generalized abdominal pain: Secondary | ICD-10-CM

## 2020-05-22 DIAGNOSIS — R140 Abdominal distension (gaseous): Secondary | ICD-10-CM

## 2020-05-25 ENCOUNTER — Inpatient Hospital Stay
Admission: EM | Admit: 2020-05-25 | Discharge: 2020-05-28 | DRG: 381 | Disposition: A | Discharge status: Home / Self Care | Payer: 59 | Attending: Surgery | Admitting: Surgery

## 2020-05-25 ENCOUNTER — Other Ambulatory Visit: Payer: Self-pay

## 2020-05-25 ENCOUNTER — Ambulatory Visit
Admission: RE | Admit: 2020-05-25 | Discharge: 2020-05-25 | Disposition: A | Discharge status: Home / Self Care | Payer: 59 | Source: Ambulatory Visit | Attending: Family Medicine | Admitting: Family Medicine

## 2020-05-25 DIAGNOSIS — K420 Umbilical hernia with obstruction, without gangrene: Secondary | ICD-10-CM | POA: Diagnosis present

## 2020-05-25 DIAGNOSIS — Z791 Long term (current) use of non-steroidal anti-inflammatories (NSAID): Secondary | ICD-10-CM | POA: Diagnosis not present

## 2020-05-25 DIAGNOSIS — K251 Acute gastric ulcer with perforation: Principal | ICD-10-CM | POA: Diagnosis present

## 2020-05-25 DIAGNOSIS — R140 Abdominal distension (gaseous): Secondary | ICD-10-CM | POA: Insufficient documentation

## 2020-05-25 DIAGNOSIS — K219 Gastro-esophageal reflux disease without esophagitis: Secondary | ICD-10-CM | POA: Diagnosis present

## 2020-05-25 DIAGNOSIS — F17210 Nicotine dependence, cigarettes, uncomplicated: Secondary | ICD-10-CM | POA: Diagnosis present

## 2020-05-25 DIAGNOSIS — K275 Chronic or unspecified peptic ulcer, site unspecified, with perforation: Secondary | ICD-10-CM | POA: Diagnosis present

## 2020-05-25 DIAGNOSIS — Z79899 Other long term (current) drug therapy: Secondary | ICD-10-CM | POA: Diagnosis not present

## 2020-05-25 DIAGNOSIS — Z20822 Contact with and (suspected) exposure to covid-19: Secondary | ICD-10-CM | POA: Diagnosis present

## 2020-05-25 DIAGNOSIS — R1013 Epigastric pain: Secondary | ICD-10-CM | POA: Diagnosis not present

## 2020-05-25 DIAGNOSIS — R1084 Generalized abdominal pain: Secondary | ICD-10-CM | POA: Insufficient documentation

## 2020-05-25 LAB — COMPREHENSIVE METABOLIC PANEL
ALT: 11 U/L (ref 0–44)
AST: 15 U/L (ref 15–41)
Albumin: 3.1 g/dL — ABNORMAL LOW (ref 3.5–5.0)
Alkaline Phosphatase: 59 U/L (ref 38–126)
Anion gap: 8 (ref 5–15)
BUN: 12 mg/dL (ref 6–20)
CO2: 27 mmol/L (ref 22–32)
Calcium: 8.6 mg/dL — ABNORMAL LOW (ref 8.9–10.3)
Chloride: 103 mmol/L (ref 98–111)
Creatinine, Ser: 0.41 mg/dL — ABNORMAL LOW (ref 0.44–1.00)
GFR, Estimated: >60 mL/min (ref >60)
Glucose, Bld: 89 mg/dL (ref 70–99)
Potassium: 3.4 mmol/L — ABNORMAL LOW (ref 3.5–5.1)
Sodium: 138 mmol/L (ref 135–145)
Total Bilirubin: 0.4 mg/dL (ref 0.3–1.2)
Total Protein: 7.2 g/dL (ref 6.5–8.1)

## 2020-05-25 LAB — CBC WITH DIFFERENTIAL/PLATELET
Abs Immature Granulocytes: 0.01 10*3/uL (ref 0.00–0.07)
Basophils Absolute: 0 10*3/uL (ref 0.0–0.1)
Basophils Relative: 1 %
Eosinophils Absolute: 0.3 10*3/uL (ref 0.0–0.5)
Eosinophils Relative: 6 %
HCT: 33.6 % — ABNORMAL LOW (ref 36.0–46.0)
Hemoglobin: 10.3 g/dL — ABNORMAL LOW (ref 12.0–15.0)
Immature Granulocytes: 0 %
Lymphocytes Relative: 21 %
Lymphs Abs: 1.2 10*3/uL (ref 0.7–4.0)
MCH: 25.1 pg — ABNORMAL LOW (ref 26.0–34.0)
MCHC: 30.7 g/dL (ref 30.0–36.0)
MCV: 82 fL (ref 80.0–100.0)
Monocytes Absolute: 0.3 10*3/uL (ref 0.1–1.0)
Monocytes Relative: 6 %
Neutro Abs: 3.7 10*3/uL (ref 1.7–7.7)
Neutrophils Relative %: 66 %
Platelets: 403 10*3/uL — ABNORMAL HIGH (ref 150–400)
RBC: 4.1 MIL/uL (ref 3.87–5.11)
RDW: 14.2 % (ref 11.5–15.5)
WBC: 5.6 10*3/uL (ref 4.0–10.5)
nRBC: 0 % (ref 0.0–0.2)

## 2020-05-25 LAB — URINALYSIS, COMPLETE (UACMP) WITH MICROSCOPIC
Bacteria, UA: NONE SEEN
Bilirubin Urine: NEGATIVE
Glucose, UA: NEGATIVE mg/dL
Ketones, ur: NEGATIVE mg/dL
Leukocytes,Ua: NEGATIVE
Nitrite: NEGATIVE
Protein, ur: NEGATIVE mg/dL
Specific Gravity, Urine: >1.046 — ABNORMAL HIGH (ref 1.005–1.030)
pH: 7 (ref 5.0–8.0)

## 2020-05-25 LAB — LACTIC ACID, PLASMA: Lactic Acid, Venous: 0.7 mmol/L (ref 0.5–1.9)

## 2020-05-25 LAB — RESP PANEL BY RT-PCR (FLU A&B, COVID) ARPGX2
Influenza A by PCR: NEGATIVE
Influenza B by PCR: NEGATIVE
SARS Coronavirus 2 by RT PCR: NEGATIVE

## 2020-05-25 LAB — PROTIME-INR
INR: 1 (ref 0.8–1.2)
Prothrombin Time: 13.2 seconds (ref 11.4–15.2)

## 2020-05-25 LAB — APTT: aPTT: 33 seconds (ref 24–36)

## 2020-05-25 MED ORDER — LACTATED RINGERS IV SOLN: INTRAVENOUS | Status: DC

## 2020-05-25 MED ORDER — ONDANSETRON 4 MG PO TBDP: 4.0000 mg | ORAL_TABLET | Freq: Four times a day (QID) | ORAL | Status: DC | PRN

## 2020-05-25 MED ORDER — HYDROCODONE-ACETAMINOPHEN 5-325 MG PO TABS: 1.0000 | ORAL_TABLET | ORAL | Status: DC | PRN

## 2020-05-25 MED ORDER — PIPERACILLIN-TAZOBACTAM 3.375 G IVPB: 3.3750 g | Freq: Three times a day (TID) | INTRAVENOUS | Status: DC

## 2020-05-25 MED ORDER — IOHEXOL 300 MG/ML  SOLN
100.0000 mL | Freq: Once | INTRAMUSCULAR | Status: AC | PRN
  Administered 2020-05-25: 85 mL via INTRAVENOUS

## 2020-05-25 MED ORDER — MORPHINE SULFATE (PF) 2 MG/ML IV SOLN: 2.0000 mg | INTRAVENOUS | Status: DC | PRN

## 2020-05-25 MED ORDER — PIPERACILLIN-TAZOBACTAM 3.375 G IVPB
3.3750 g | Freq: Three times a day (TID) | INTRAVENOUS | Status: DC
  Administered 2020-05-25 – 2020-05-28 (×9): 3.375 g via INTRAVENOUS
  Filled 2020-05-25 (×9): qty 50

## 2020-05-25 MED ORDER — LACTATED RINGERS IV BOLUS (SEPSIS)
1000.0000 mL | Freq: Once | INTRAVENOUS | Status: AC
  Administered 2020-05-25: 1000 mL via INTRAVENOUS

## 2020-05-25 MED ORDER — METRONIDAZOLE IN NACL 5-0.79 MG/ML-% IV SOLN
500.0000 mg | Freq: Once | INTRAVENOUS | Status: AC
  Administered 2020-05-25: 500 mg via INTRAVENOUS
  Filled 2020-05-25: qty 100

## 2020-05-25 MED ORDER — SODIUM CHLORIDE 0.9 % IV SOLN
2.0000 g | Freq: Once | INTRAVENOUS | Status: AC
  Administered 2020-05-25: 2 g via INTRAVENOUS
  Filled 2020-05-25: qty 2

## 2020-05-25 MED ORDER — ONDANSETRON HCL 4 MG/2ML IJ SOLN: 4.0000 mg | Freq: Four times a day (QID) | INTRAMUSCULAR | Status: DC | PRN

## 2020-05-25 NOTE — ED Provider Notes (Signed)
[facility] Emergency Department Provider Note    Event Date/Time   First MD Initiated Contact with Patient 05/25/20 [phone]     (approximate)  I have reviewed the triage vital signs and the nursing notes.   HISTORY  Chief Complaint Abdominal Pain    HPI Shelly Ward is a 55 y.o. female below listed past medical history and chronic hip pain presents to the ER for evaluation of progressively worsening epigastric pain associate with abdominal bloating.  Having some nausea but no vomiting.  States that she been taking meloxicam as well as BC Goody's daily for the past several months.  Denies any bloody stools.  She had visit with her PCP last week and had CT imaging done this morning for abdominal pain which showed evidence of perforated gastric ulcer.  Sent to the ER for further evaluation management    Past Medical History:  Diagnosis Date  . Acute anxiety 08/21/2014  . Anxiety   . Arthritis    hip  . Depression 01/23/2017  . 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 GERD (gastroesophageal reflux disease)    occ-no meds  . 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 Scoliosis 01/23/2017   Family History  Problem Relation Age of Onset  . Arthritis Mother   . Diabetes Father    Past Surgical History:  Procedure Laterality Date  . BREAST BIOPSY Right 04/06/2017   Procedure: BREAST BIOPSY WITH NEEDLE LOCALIZATION;  Surgeon: Clayburn Pert, MD;  Location: [facility] ORS;  Service: General;  Laterality: Right;  . BREAST EXCISIONAL BIOPSY    . BREAST LUMPECTOMY Right 2019   intraductual papilloma  . TONSILLECTOMY    . WISDOM TOOTH EXTRACTION     Patient Active Problem List   Diagnosis Date Noted  . Perforated peptic ulcer ([place]) 05/25/2020  . Breast mass, right   . Depression 01/23/2017  . 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 Scoliosis 01/23/2017  . Anxiety 08/21/2014      Prior to Admission medications   Medication Sig Start Date End  Date Taking? Authorizing Provider  FLUoxetine (PROZAC) 10 MG tablet Take 1 tablet (10 mg total) by mouth at bedtime. 03/16/18   Juline Patch, MD  Multiple Vitamin (MULTI-VITAMINS) TABS Take 1 tablet by mouth daily.    [provider]  fluticasone (FLONASE) 50 MCG/ACT nasal spray Place 1 spray into both nostrils daily as needed for allergies or rhinitis.  02/12/19  [provider]    Allergies Patient has no known allergies.    Social History Social History   Tobacco Use  . Smoking status: Current Every Day Smoker    Packs/day: 0.50    Years: 15.00    Pack years: 7.50    Types: Cigarettes  . Smokeless tobacco: Never Used  . Tobacco comment: 1/2 pack/ day  Vaping Use  . Vaping Use: Never used  Substance Use Topics  . Alcohol use: No  . Drug use: No    Review of Systems Patient denies headaches, rhinorrhea, blurry vision, numbness, shortness of breath, chest pain, edema, cough, abdominal pain, nausea, vomiting, diarrhea, dysuria, fevers, rashes or hallucinations unless otherwise stated above in HPI. ____________________________________________   PHYSICAL EXAM:  VITAL SIGNS: Vitals:   05/25/20 1000 05/25/20 1030  BP: 125/78 131/82  Pulse: 89 85  Resp: (!) 24 (!) 23  Temp:    SpO2: 99% 100%    Constitutional: Alert and oriented.  Eyes: Conjunctivae are normal.  Head: Atraumatic. Nose: No congestion/rhinnorhea. Mouth/Throat: Mucous membranes are moist.   Neck: No stridor. Painless ROM.  Cardiovascular: Normal rate, regular rhythm. Grossly normal heart sounds.  Good peripheral circulation. Respiratory: Normal respiratory effort.  No retractions. Lungs CTAB. Gastrointestinal: Soft with mild ttp in epigastric region. No distention. No abdominal bruits. No CVA tenderness. Genitourinary:  Musculoskeletal: No lower extremity tenderness nor edema.  No joint effusions. Neurologic:  Normal speech and language. No gross focal neurologic deficits are  appreciated. No facial droop Skin:  Skin is warm, dry and intact. No rash noted. Psychiatric: Mood and affect are normal. Speech and behavior are normal.  ____________________________________________   LABS (all labs ordered are listed, but only abnormal results are displayed)  Results for orders placed or performed during the hospital encounter of 05/25/20 (from the past 24 hour(s))  Lactic acid, plasma     Status: None   Collection Time: 05/25/20 10:28 AM  Result Value Ref Range   Lactic Acid, Venous 0.7 0.5 - 1.9 mmol/L  Comprehensive metabolic panel     Status: Abnormal   Collection Time: 05/25/20 10:28 AM  Result Value Ref Range   Sodium 138 135 - 145 mmol/L   Potassium 3.4 (L) 3.5 - 5.1 mmol/L   Chloride 103 98 - 111 mmol/L   CO2 27 22 - 32 mmol/L   Glucose, Bld 89 70 - 99 mg/dL   BUN 12 6 - 20 mg/dL   Creatinine, Ser 0.41 (L) 0.44 - 1.00 mg/dL   Calcium 8.6 (L) 8.9 - 10.3 mg/dL   Total Protein 7.2 6.5 - 8.1 g/dL   Albumin 3.1 (L) 3.5 - 5.0 g/dL   AST 15 15 - 41 U/L   ALT 11 0 - 44 U/L   Alkaline Phosphatase 59 38 - 126 U/L   Total Bilirubin 0.4 0.3 - 1.2 mg/dL   GFR, Estimated >60 >60 mL/min   Anion gap 8 5 - 15  CBC WITH DIFFERENTIAL     Status: Abnormal   Collection Time: 05/25/20 10:28 AM  Result Value Ref Range   WBC 5.6 4.0 - 10.5 K/uL   RBC 4.10 3.87 - 5.11 MIL/uL   Hemoglobin 10.3 (L) 12.0 - 15.0 g/dL   HCT 33.6 (L) 36.0 - 46.0 %   MCV 82.0 80.0 - 100.0 fL   MCH 25.1 (L) 26.0 - 34.0 pg   MCHC 30.7 30.0 - 36.0 g/dL   RDW 14.2 11.5 - 15.5 %   Platelets 403 (H) 150 - 400 K/uL   nRBC 0.0 0.0 - 0.2 %   Neutrophils Relative % 66 %   Neutro Abs 3.7 1.7 - 7.7 K/uL   Lymphocytes Relative 21 %   Lymphs Abs 1.2 0.7 - 4.0 K/uL   Monocytes Relative 6 %   Monocytes Absolute 0.3 0.1 - 1.0 K/uL   Eosinophils Relative 6 %   Eosinophils Absolute 0.3 0.0 - 0.5 K/uL   Basophils Relative 1 %   Basophils Absolute 0.0 0.0 - 0.1 K/uL   Immature Granulocytes 0 %   Abs  Immature Granulocytes 0.01 0.00 - 0.07 K/uL  Protime-INR     Status: None   Collection Time: 05/25/20 10:28 AM  Result Value Ref Range   Prothrombin Time 13.2 11.4 - 15.2 seconds   INR 1.0 0.8 - 1.2  APTT     Status: None   Collection Time: 05/25/20 10:28 AM  Result Value Ref Range   aPTT 33 24 - 36 seconds   ____________________________________________  EKG  My review and personal interpretation at Time: 9:39   Indication: abd pain  Rate: 90  Rhythm: sinus Axis: normal Other: normal intervals, no stemi ____________________________________________  RADIOLOGY  CT reviewed ____________________________________________   PROCEDURES  Procedure(s) performed:  Procedures    Critical Care performed: no ____________________________________________   INITIAL IMPRESSION / ASSESSMENT AND PLAN / ED COURSE  Pertinent labs & imaging results that were available during my care of the patient were reviewed by me and considered in my medical decision making (see chart for details).   DDX: Perforation, mass, ulcer, abscess, SBO, sepsis  Rocquel Askren is a 55 y.o. who presents to the ED with presentation as described above.  Patient clinically fairly well-appearing but CT imaging showing evidence of probable perforated gastric ulcer with developing abscess.  She is hemodynamically stable.  IV antibiotics were ordered.  Blood work ordered and sent for the but differential.  I consulted Dr. Lysle Pearl general surgery who kindly agrees to admit patient for further management.     The patient was evaluated in Emergency Department today for the symptoms described in the history of present illness. He/she was evaluated in the context of the global COVID-19 pandemic, which necessitated consideration that the patient might be at risk for infection with the SARS-CoV-2 virus that causes COVID-19. Institutional protocols and algorithms that pertain to the evaluation of patients at risk for COVID-19 are  in a state of rapid change based on information released by regulatory bodies including the CDC and federal and state organizations. These policies and algorithms were followed during the patient's care in the ED.  As part of my medical decision making, I reviewed the following data within the [place] notes reviewed and incorporated, Labs reviewed, notes from prior ED visits and  Controlled Substance Database   ____________________________________________   FINAL CLINICAL IMPRESSION(S) / ED DIAGNOSES  Final diagnoses:  Acute gastric ulcer with perforation ([place])      NEW MEDICATIONS STARTED DURING THIS VISIT:  New Prescriptions   No medications on file     Note:  This document was prepared using Dragon voice recognition software and may include unintentional dictation errors.    Merlyn Lot, MD 05/25/20 [phone]

## 2020-05-25 NOTE — Plan of Care (Signed)

## 2020-05-25 NOTE — ED Triage Notes (Signed)
Pt comes via POV from [facility] with c/o abdominal pain. Pt states she as at worked and called to come to ED.  This RN received call from [facility] MD stating pt has a perforated ulcer and fluid in abdomen confirmed by scan.  Pt states this started last Monday.

## 2020-05-25 NOTE — H&P (Signed)
Subjective:   CC: Perforated peptic ulcer  HPI:  Shelly Ward is a 55 y.o. female who was consulted by Quentin Cornwall for evaluation of above.  First noted 1 week ago.  Symptoms include: Pain is sharp, intermittent.  Exacerbated by eating.  Alleviated by nothing specific.  Associated with nausea.    Noticed an umbilical hernia around the time of when the pain started.  Daily use of BC powder   Past Medical History:  has a past medical history of Acute anxiety (08/21/2014), Anxiety, Arthritis, Depression (01/23/2017), Dislocated hip ([place]), GERD (gastroesophageal reflux disease), Papilloma of right breast (01/23/2017), and Scoliosis (01/23/2017).  Past Surgical History:  has a past surgical history that includes Tonsillectomy; Wisdom tooth extraction; Breast biopsy (Right, 04/06/2017); Breast lumpectomy (Right, 2019); and Breast excisional biopsy.  Family History: family history includes Arthritis in her mother; Diabetes in her father.  Social History:  reports that she has been smoking cigarettes. She has a 7.50 pack-year smoking history. She has never used smokeless tobacco. She reports that she does not drink alcohol and does not use drugs.  Current Medications:  Prior to Admission medications   Medication Sig Start Date End Date Taking? Authorizing Provider  FLUoxetine (PROZAC) 10 MG tablet Take 1 tablet (10 mg total) by mouth at bedtime. 03/16/18  Yes Juline Patch, MD  meloxicam (MOBIC) 15 MG tablet Take 15 mg by mouth daily. 05/21/20  Yes [provider]  Multiple Vitamin (MULTI-VITAMINS) TABS Take 1 tablet by mouth daily.   Yes [provider]  fluticasone (FLONASE) 50 MCG/ACT nasal spray Place 1 spray into both nostrils daily as needed for allergies or rhinitis.  02/12/19  [provider]    Allergies:  No Known Allergies  ROS:  General: Denies weight loss, weight gain, fatigue, fevers, chills, and night sweats. Eyes: Denies blurry vision, double vision,  eye pain, itchy eyes, and tearing. Ears: Denies hearing loss, earache, and ringing in ears. Nose: Denies sinus pain, congestion, infections, runny nose, and nosebleeds. Mouth/throat: Denies hoarseness, sore throat, bleeding gums, and difficulty swallowing. Heart: Denies chest pain, palpitations, racing heart, irregular heartbeat, leg pain or swelling, and decreased activity tolerance. Respiratory: Denies breathing difficulty, shortness of breath, wheezing, cough, and sputum. GI: Denies change in appetite, heartburn, nausea, vomiting, constipation, diarrhea, and blood in stool. GU: Denies difficulty urinating, pain with urinating, urgency, frequency, blood in urine. Musculoskeletal: Denies joint stiffness, pain, swelling, muscle weakness. Skin: Denies rash, itching, mass, tumors, sores, and boils Neurologic: Denies headache, fainting, dizziness, seizures, numbness, and tingling. Psychiatric: Denies depression, anxiety, difficulty sleeping, and memory loss. Endocrine: Denies heat or cold intolerance, and increased thirst or urination. Blood/lymph: Denies easy bruising, easy bruising, and swollen glands     Objective:     BP 126/83   Pulse 87   Temp 98 F (36.7 C) (Oral)   Resp 20   Ht 5\' 1"  (1.549 m)   Wt 52.3 kg   LMP 09/27/2016 (Approximate)   SpO2 100%   BMI 21.79 kg/m   Constitutional :  alert, cooperative, appears stated age and no distress  Lymphatics/Throat:  no asymmetry, masses, or scars  Respiratory:  clear to auscultation bilaterally  Cardiovascular:  regular rate and rhythm  Gastrointestinal: Soft, no guarding, focal tenderness to palpation in epigastric region as well as the incarcerated umbilical hernia.  The umbilical hernia does not have any overlying skin changes..  Musculoskeletal: Steady gait and movement  Skin: Cool and moist, no surgical scars  Psychiatric: Normal affect,  non-agitated, not confused       LABS:  CMP Latest Ref Rng & Units 05/25/2020   Glucose 70 - 99 mg/dL 89  BUN 6 - 20 mg/dL 12  Creatinine 0.44 - 1.00 mg/dL 0.41(L)  Sodium 135 - 145 mmol/L 138  Potassium 3.5 - 5.1 mmol/L 3.4(L)  Chloride 98 - 111 mmol/L 103  CO2 22 - 32 mmol/L 27  Calcium 8.9 - 10.3 mg/dL 8.6(L)  Total Protein 6.5 - 8.1 g/dL 7.2  Total Bilirubin 0.3 - 1.2 mg/dL 0.4  Alkaline Phos 38 - 126 U/L 59  AST 15 - 41 U/L 15  ALT 0 - 44 U/L 11   CBC Latest Ref Rng & Units 05/25/2020  WBC 4.0 - 10.5 K/uL 5.6  Hemoglobin 12.0 - 15.0 g/dL 10.3(L)  Hematocrit 36.0 - 46.0 % 33.6(L)  Platelets 150 - 400 K/uL 403(H)    RADS: ADDENDUM REPORT: 05/25/2020 09:51  ADDENDUM: Note that this degree of gastric wall thickening could potentially could indicate underlying gastric neoplasm with potential ulceration and perforation.   Electronically Signed   By: Lowella Grip III M.D.   On: 05/25/2020 09:51   Addended by Erling Conte, MD on 05/25/2020 9:53 AM    Study Result  Narrative & Impression  CLINICAL DATA:  Abdominal pain  EXAM: CT ABDOMEN AND PELVIS WITH CONTRAST  TECHNIQUE: Multidetector CT imaging of the abdomen and pelvis was performed using the standard protocol following bolus administration of intravenous contrast. Oral contrast also administered.  CONTRAST:  6mL OMNIPAQUE IOHEXOL 300 MG/ML  SOLN  COMPARISON:  None.  FINDINGS: Lower chest: Lung bases are clear.  Hepatobiliary: There is a 5 mm cyst with a nearby 3 mm cyst in the dome of the liver on the right. No other focal liver lesions are evident. Gallbladder wall is not appreciably thickened. There is no biliary duct dilatation.  Pancreas: No pancreatic mass or inflammatory focus evident.  Spleen: No appreciable splenic lesions.  Adrenals/Urinary Tract: Adrenals bilaterally appear normal. There is a cyst in the lateral mid right kidney measuring 1.2 x 1.2 cm. There is a parapelvic cyst on the left measuring 1.8 x 1.8 cm with an adjacent  parapelvic cyst on the left measuring 2.4 x 1.1 cm. No appreciable hydronephrosis on either side. There is no appreciable renal or ureteral calculus on either side. Urinary bladder is midline with wall thickness within normal limits.  Stomach/Bowel: There is marked thickening of the wall of the distal half of the stomach with a collection of air in the anterior aspect of the antrum concerning for potential ulceration. Anterior to the stomach, there is a fluid collection containing air measuring 7.3 x 1.9 cm, concerning for area of gastric ulcer perforation with developing abscess in this area. This extraluminal fluid surrounds the left lobe of the liver near the junction with the stomach. The small and large bowel do not appear appreciably thickened. There is no evident bowel obstruction. The terminal ileum appears unremarkable.  There is a focus of air within an umbilical hernia that cannot be associated with bowel and consistent with localized pneumoperitoneum in this area. There is no portal venous air. Appendix is not appreciable. No periappendiceal region inflammation evident.  Vascular/Lymphatic: No hyperdense vessel. There are foci of aortic atherosclerosis. No adenopathy is appreciable in the abdomen or pelvis.  Reproductive: The uterus is retroverted. No adnexal masses. There is fluid in the cul-de-sac region.  Other: As stated above, there is an umbilical hernia containing an area  concerning for localized pneumoperitoneum. This hernia at its neck measures 2.1 x 2.0 cm.  Musculoskeletal: There are pars defects at L4 bilaterally with 9 mm of anterolisthesis of L4 on L5. There is degenerative change in the lumbar spine. No blastic or lytic bone lesions. No intramuscular lesions are evident.  IMPRESSION: 1. Marked thickening of the wall of the distal half of the stomach with evidence of suspected perforated ulcer in this area. Fluid collection containing air is  seen anterior to the stomach immediately adjacent to the left lobe of the liver measuring 7.3 x 1.9 cm, a likely developing abscess secondary to apparent ulcer perforation. The wall thickening of the stomach involves much of the lesser curvature as well as the distal body of the stomach and essentially the entire antrum of the stomach. Surgical consultation advised.  2. Localized collection of air is seen in a focal umbilical hernia that cannot be associated with bowel. This collection of air may be secondary to pneumoperitoneum, likely due to suspected recent gastric ulcer perforation.  3. No bowel obstruction. No abscess present apart from the suspected developing abscess along the anterior aspect of the stomach in the left upper abdomen.  4. Small amount of fluid in cul-de-sac region may well be of reactive etiology given the changes in the left upper abdomen.  5.  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  Critical Value/emergent results were called by telephone at the time of interpretation on 05/25/2020 at 9:00 am to provider [facility] , who verbally acknowledged these results.  Electronically Signed: By: Lowella Grip III M.D. On: 05/25/2020 09:00      Assessment:      Perforated gastric ulcer Symptoms started approximately 1 week ago and continued but never resolved  Plan:     Review of CT scan by myself notes minimal pneumoperitoneum, in a patient with stable vitals and no signs of acute peritonitis at this time.  Patient likely had a perforation that is healing on its own.  Immediate surgical intervention around this timeframe may cause more harm than good.  We will monitor the patient with strict n.p.o. for another day or so to see if any continued improvement in the pain.  Will order a upper GI study to ensure no leaking contrast after period of observation.  Due to the formation of this fluid collection around the area of  concern patient may still need IR guided drainage for this fluid collection.  Will consider once the upper GI study comes back negative.  Continue antibiotics till then as well.

## 2020-05-25 NOTE — Consult Note (Signed)
PHARMACY -  BRIEF ANTIBIOTIC NOTE   Pharmacy has received consult(s) for Cefepime from an ED provider for IAI & Sepsis.  The patient's profile has been reviewed for ht/wt/allergies/indication/available labs.    One time order(s) placed for: Cefepime 2g x1 Metronidazole 500mg  x1  Further antibiotics/pharmacy consults should be ordered by admitting physician if indicated.                       Thank you, Lorna Dibble 05/25/2020  10:04 AM

## 2020-05-25 NOTE — Consult Note (Signed)
CODE SEPSIS - PHARMACY COMMUNICATION  **Broad Spectrum Antibiotics should be administered within 1 hour of Sepsis diagnosis**  Time Code Sepsis Called/Page Received: 0950  Antibiotics Ordered: 0950  Time of 1st antibiotic administration: 1035  Additional action taken by pharmacy: N/A  If necessary, Name of Provider/Nurse Contacted: N/A    Lorna Dibble ,PharmD Clinical Pharmacist  05/25/2020  10:05 AM

## 2020-05-25 NOTE — Consult Note (Addendum)
Pharmacy Antibiotic Note  Shelly Ward is a 55 y.o. female admitted on 05/25/2020 with intra-abdominal infection after perforated gastric ulcer 2/2 NSAID use (BC Goody's daily + meloxicam). CT imaging showing evidence of developing abscess at site of perforation. Pharmacy has been consulted for Zosyn dosing.  Plan: Initiate Zosyn 3.375g q8hrs at next cefepime/metronidazole dosing interval.   Temp (24hrs), Avg:98 F (36.7 C), Min:98 F (36.7 C), Max:98 F (36.7 C)  Recent Labs  Lab 05/25/20 1028  WBC 5.6  CREATININE 0.41*  LATICACIDVEN 0.7    CrCl cannot be calculated (Unknown ideal weight.).    No Known Allergies  Antimicrobials this admission: Cefepime x1 ED  (3/21) Flagyl x1 ED  (3/21)  Microbiology results: 3/21 BCx: sent/pending 3/21 UCx: sent/pending  3/21 PCR flu/covid - pending  Thank you for allowing pharmacy to be a part of this patient's care.  Shanon Brow Yunuen Mordan 05/25/2020 11:06 AM

## 2020-05-26 LAB — HIV ANTIBODY (ROUTINE TESTING W REFLEX): HIV Screen 4th Generation wRfx: NONREACTIVE

## 2020-05-26 LAB — CBC
HCT: 32.1 % — ABNORMAL LOW (ref 36.0–46.0)
Hemoglobin: 10 g/dL — ABNORMAL LOW (ref 12.0–15.0)
MCH: 25.1 pg — ABNORMAL LOW (ref 26.0–34.0)
MCHC: 31.2 g/dL (ref 30.0–36.0)
MCV: 80.5 fL (ref 80.0–100.0)
Platelets: 388 10*3/uL (ref 150–400)
RBC: 3.99 MIL/uL (ref 3.87–5.11)
RDW: 13.9 % (ref 11.5–15.5)
WBC: 5.4 10*3/uL (ref 4.0–10.5)
nRBC: 0 % (ref 0.0–0.2)

## 2020-05-26 LAB — BASIC METABOLIC PANEL
Anion gap: 10 (ref 5–15)
BUN: 10 mg/dL (ref 6–20)
CO2: 26 mmol/L (ref 22–32)
Calcium: 8.5 mg/dL — ABNORMAL LOW (ref 8.9–10.3)
Chloride: 106 mmol/L (ref 98–111)
Creatinine, Ser: 0.56 mg/dL (ref 0.44–1.00)
GFR, Estimated: >60 mL/min (ref >60)
Glucose, Bld: 72 mg/dL (ref 70–99)
Potassium: 3.7 mmol/L (ref 3.5–5.1)
Sodium: 142 mmol/L (ref 135–145)

## 2020-05-26 LAB — URINE CULTURE: Culture: <10000 — AB

## 2020-05-26 LAB — PHOSPHORUS: Phosphorus: 4.4 mg/dL (ref 2.5–4.6)

## 2020-05-26 LAB — MAGNESIUM: Magnesium: 1.9 mg/dL (ref 1.7–2.4)

## 2020-05-26 MED ORDER — KCL IN DEXTROSE-NACL 40-5-0.45 MEQ/L-%-% IV SOLN
INTRAVENOUS | Status: DC
  Filled 2020-05-26 (×5): qty 1000

## 2020-05-26 NOTE — Plan of Care (Signed)
Generalized discomfort noted but pt refuses med. Pt had one complaint of nausea earlier last evening. Pt remains NPO. Pt able to use bedside commode without problems. On IVF and IV Abx. Safety measures in place. Will continue to monitor.  Problem: Education: Goal: Knowledge of General Education information will improve Description: Including pain rating scale, medication(s)/side effects and non-pharmacologic comfort measures Outcome: Progressing   Problem: Health Behavior/Discharge Planning: Goal: Ability to manage health-related needs will improve Outcome: Progressing   Problem: Clinical Measurements: Goal: Ability to maintain clinical measurements within normal limits will improve Outcome: Progressing Goal: Will remain free from infection Outcome: Progressing Goal: Diagnostic test results will improve Outcome: Progressing Goal: Respiratory complications will improve Outcome: Progressing Goal: Cardiovascular complication will be avoided Outcome: Progressing   Problem: Activity: Goal: Risk for activity intolerance will decrease Outcome: Progressing   Problem: Nutrition: Goal: Adequate nutrition will be maintained Outcome: Progressing   Problem: Coping: Goal: Level of anxiety will decrease Outcome: Progressing   Problem: Elimination: Goal: Will not experience complications related to bowel motility Outcome: Progressing Goal: Will not experience complications related to urinary retention Outcome: Progressing   Problem: Pain Managment: Goal: General experience of comfort will improve Outcome: Progressing   Problem: Safety: Goal: Ability to remain free from injury will improve Outcome: Progressing   Problem: Skin Integrity: Goal: Risk for impaired skin integrity will decrease Outcome: Progressing

## 2020-05-27 ENCOUNTER — Inpatient Hospital Stay: Payer: 59

## 2020-05-27 LAB — CBC
HCT: 32.2 % — ABNORMAL LOW (ref 36.0–46.0)
Hemoglobin: 9.9 g/dL — ABNORMAL LOW (ref 12.0–15.0)
MCH: 24.9 pg — ABNORMAL LOW (ref 26.0–34.0)
MCHC: 30.7 g/dL (ref 30.0–36.0)
MCV: 81.1 fL (ref 80.0–100.0)
Platelets: 386 10*3/uL (ref 150–400)
RBC: 3.97 MIL/uL (ref 3.87–5.11)
RDW: 13.9 % (ref 11.5–15.5)
WBC: 4.1 10*3/uL (ref 4.0–10.5)
nRBC: 0 % (ref 0.0–0.2)

## 2020-05-27 LAB — PHOSPHORUS: Phosphorus: 3.6 mg/dL (ref 2.5–4.6)

## 2020-05-27 LAB — BASIC METABOLIC PANEL WITH GFR
Anion gap: 8 (ref 5–15)
BUN: 7 mg/dL (ref 6–20)
CO2: 25 mmol/L (ref 22–32)
Calcium: 8.3 mg/dL — ABNORMAL LOW (ref 8.9–10.3)
Chloride: 107 mmol/L (ref 98–111)
Creatinine, Ser: 0.53 mg/dL (ref 0.44–1.00)
GFR, Estimated: >60 mL/min
Glucose, Bld: 110 mg/dL — ABNORMAL HIGH (ref 70–99)
Potassium: 3.9 mmol/L (ref 3.5–5.1)
Sodium: 140 mmol/L (ref 135–145)

## 2020-05-27 LAB — MAGNESIUM: Magnesium: 2.2 mg/dL (ref 1.7–2.4)

## 2020-05-27 MED ORDER — PANTOPRAZOLE SODIUM 40 MG IV SOLR
40.0000 mg | INTRAVENOUS | Status: DC
  Administered 2020-05-27: 40 mg via INTRAVENOUS
  Filled 2020-05-27: qty 40

## 2020-05-27 MED ORDER — PANTOPRAZOLE SODIUM 40 MG IV SOLR
40.0000 mg | Freq: Two times a day (BID) | INTRAVENOUS | Status: DC
  Administered 2020-05-27 – 2020-05-28 (×2): 40 mg via INTRAVENOUS
  Filled 2020-05-27 (×2): qty 40

## 2020-05-27 NOTE — Progress Notes (Signed)
Subjective:  CC: Shelly Ward is a 55 y.o. female  Hospital stay day 1   Peptic ulcer  HPI: No acute issues overnight  ROS:  General: Denies weight loss, weight gain, fatigue, fevers, chills, and night sweats. Heart: Denies chest pain, palpitations, racing heart, irregular heartbeat, leg pain or swelling, and decreased activity tolerance. Respiratory: Denies breathing difficulty, shortness of breath, wheezing, cough, and sputum. GI: Denies change in appetite, heartburn, nausea, vomiting, constipation, diarrhea, and blood in stool. GU: Denies difficulty urinating, pain with urinating, urgency, frequency, blood in urine.   Objective:   Vital signs stable     Height: 5\' 1"  (154.9 cm) Weight: 52.3 kg BMI (Calculated): 21.8   I  Constitutional :  alert, cooperative, appears stated age and no distress  Respiratory:  clear to auscultation bilaterally  Cardiovascular:  regular rate and rhythm  Gastrointestinal: Soft, no guarding, tenderness to palpation in epigastric region and incarcerated umbilical hernia still present but improved compared to previous exam..   Skin: Cool and moist.   Psychiatric: Normal affect, non-agitated, not confused       LABS:  Stable  RADS: n/a Assessment:   Peptic ulcer likely perforated spontaneously healed.  Continue n.p.o. for 1 more day to ensure healing continues prior to performing a upper GI study to confirm no persistent leakage.  Continue to monitor in the meantime.

## 2020-05-27 NOTE — Progress Notes (Addendum)
Subjective:  CC: Shelly Ward is a 55 y.o. female  Hospital stay day 2,   gastric ulcer  HPI: No issues overnight.  Pain continues to improve  ROS:  General: Denies weight loss, weight gain, fatigue, fevers, chills, and night sweats. Heart: Denies chest pain, palpitations, racing heart, irregular heartbeat, leg pain or swelling, and decreased activity tolerance. Respiratory: Denies breathing difficulty, shortness of breath, wheezing, cough, and sputum. GI: Denies change in appetite, heartburn, nausea, vomiting, constipation, diarrhea, and blood in stool. GU: Denies difficulty urinating, pain with urinating, urgency, frequency, blood in urine.   Objective:   Temp:  [97.6 F (36.4 C)-98.3 F (36.8 C)] 98.2 F (36.8 C) (03/23 1125) Pulse Rate:  [73-92] 90 (03/23 1125) Resp:  [16-18] 16 (03/23 1125) BP: (122-147)/(69-91) 132/85 (03/23 1125) SpO2:  [95 %-100 %] 100 % (03/23 1125)     Height: 5\' 1"  (154.9 cm) Weight: 52.3 kg BMI (Calculated): 21.8   Intake/Output this shift:   Intake/Output Summary (Last 24 hours) at 05/27/2020 1141 Last data filed at 05/26/2020 1758 Gross per 24 hour  Intake 696.86 ml  Output --  Net 696.86 ml    Constitutional :  alert, cooperative, appears stated age and no distress  Respiratory:  clear to auscultation bilaterally  Cardiovascular:  regular rate and rhythm  Gastrointestinal: focal TTP in epigastric area, continues to improve, along with the TTP in the umbilical hernia.   Skin: Cool and moist.   Psychiatric: Normal affect, non-agitated, not confused       LABS:  CMP Latest Ref Rng & Units 05/27/2020 05/26/2020 05/25/2020  Glucose 70 - 99 mg/dL 110(H) 72 89  BUN 6 - 20 mg/dL 7 10 12   Creatinine 0.44 - 1.00 mg/dL 0.53 0.56 0.41(L)  Sodium 135 - 145 mmol/L 140 142 138  Potassium 3.5 - 5.1 mmol/L 3.9 3.7 3.4(L)  Chloride 98 - 111 mmol/L 107 106 103  CO2 22 - 32 mmol/L 25 26 27   Calcium 8.9 - 10.3 mg/dL 8.3(L) 8.5(L) 8.6(L)  Total  Protein 6.5 - 8.1 g/dL - - 7.2  Total Bilirubin 0.3 - 1.2 mg/dL - - 0.4  Alkaline Phos 38 - 126 U/L - - 59  AST 15 - 41 U/L - - 15  ALT 0 - 44 U/L - - 11   CBC Latest Ref Rng & Units 05/27/2020 05/26/2020 05/25/2020  WBC 4.0 - 10.5 K/uL 4.1 5.4 5.6  Hemoglobin 12.0 - 15.0 g/dL 9.9(L) 10.0(L) 10.3(L)  Hematocrit 36.0 - 46.0 % 32.2(L) 32.1(L) 33.6(L)  Platelets 150 - 400 K/uL 386 388 403(H)    RADS: CLINICAL DATA:  Perforated ulcer.  EXAM: WATER SOLUBLE UPPER GI SERIES  TECHNIQUE: Single-column upper GI series was performed using water soluble contrast.  CONTRAST:  Gastrografin.  COMPARISON:  CT 05/25/2020.  FLUOROSCOPY TIME:  Fluoroscopy Time:  2 minutes 30 seconds  Radiation Exposure Index (if provided by the fluoroscopic device): 52.9 mGy  FINDINGS: Esophagus is widely patent. No reflux. A very large gastric ulceration is noted along the lesser curvature of the stomach. Duodenal bulb appears normal. Visualized C-loop appears normal. Residual contrast noted in the bowel from prior CT.  IMPRESSION: A very large gastric ulceration is noted along the lesser curvature of the stomach.  These results will be called to the ordering clinician or representative by the Radiologist Assistant, and communication documented in the PACS or [employer].   Electronically Signed   By: Marcello Moores  Register   On: 05/27/2020 09:31  Assessment:  Peptic ulcer, likely perforated, spontaneously healed now. Pt continues to remain clinically stable.   Confirmed no leak on upper GI as noted above this am.  Will resume clears.  Continue PPI.  Discussed case with GI and they will f/u as outpt.  H.pylori stool antigen ordered in the meantime.  All questions addressed at this time.

## 2020-05-28 LAB — CBC
HCT: 32.4 % — ABNORMAL LOW (ref 36.0–46.0)
Hemoglobin: 9.9 g/dL — ABNORMAL LOW (ref 12.0–15.0)
MCH: 24.9 pg — ABNORMAL LOW (ref 26.0–34.0)
MCHC: 30.6 g/dL (ref 30.0–36.0)
MCV: 81.6 fL (ref 80.0–100.0)
Platelets: 398 10*3/uL (ref 150–400)
RBC: 3.97 MIL/uL (ref 3.87–5.11)
RDW: 13.9 % (ref 11.5–15.5)
WBC: 2.9 10*3/uL — ABNORMAL LOW (ref 4.0–10.5)
nRBC: 0 % (ref 0.0–0.2)

## 2020-05-28 LAB — BASIC METABOLIC PANEL
Anion gap: 8 (ref 5–15)
BUN: 5 mg/dL — ABNORMAL LOW (ref 6–20)
CO2: 26 mmol/L (ref 22–32)
Calcium: 8.5 mg/dL — ABNORMAL LOW (ref 8.9–10.3)
Chloride: 109 mmol/L (ref 98–111)
Creatinine, Ser: 0.6 mg/dL (ref 0.44–1.00)
GFR, Estimated: >60 mL/min (ref >60)
Glucose, Bld: 103 mg/dL — ABNORMAL HIGH (ref 70–99)
Potassium: 4.4 mmol/L (ref 3.5–5.1)
Sodium: 143 mmol/L (ref 135–145)

## 2020-05-28 LAB — PHOSPHORUS: Phosphorus: 4.1 mg/dL (ref 2.5–4.6)

## 2020-05-28 LAB — MAGNESIUM: Magnesium: 2.3 mg/dL (ref 1.7–2.4)

## 2020-05-28 MED ORDER — DOCUSATE SODIUM 100 MG PO CAPS: 100.0000 mg | ORAL_CAPSULE | Freq: Two times a day (BID) | ORAL | 0 refills | Status: DC | PRN

## 2020-05-28 MED ORDER — AMOXICILLIN-POT CLAVULANATE 875-125 MG PO TABS: 1.0000 | ORAL_TABLET | Freq: Two times a day (BID) | ORAL | 0 refills | Status: AC

## 2020-05-28 MED ORDER — PANTOPRAZOLE SODIUM 40 MG PO TBEC: 40.0000 mg | DELAYED_RELEASE_TABLET | Freq: Two times a day (BID) | ORAL | 0 refills | Status: AC

## 2020-05-28 NOTE — Discharge Summary (Signed)
Physician Discharge Summary  Patient ID: Shelly Ward MRN: [id number] DOB/AGE: 08/24/1965 55 y.o.  Admit date: 05/25/2020 Discharge date: 05/28/20  Admission Diagnoses: Perforated peptic ulcer  Discharge Diagnoses:  Same as above, incarcerated umbilical hernia  Discharged Condition: good  Hospital Course: Diagnosed with above.  Due to overall vital stable signs and reassuring clinical exam, with the duration of the symptoms of over a week before presentation, decision was made to observe her up instead of taking her directly to the operating room from repair.  Upper GI study couple days into the hospital stay showed no leak and patient continued to improve pain wise after status of n.p.o. diet was slowly resumed with no recurrence of pain or other lab abnormalities.  At the time of discharge patient was tolerating a regular diet having bowel movements and no complaints of pain.  She will follow up with gastroenterology for further monitoring and work-up for this perforated peptic ulcer.  H. pylori stool antigen was still pending at time of discharge.  She will be sent home on Protonix 40 mg twice daily, and discussed extensively to avoid any NSAIDs including BC powder and Goody powder.  Goody powder use was daily prior to hospitalization, this was thought to be the cause of the perforation.  She can follow-up with the surgery office as needed if her umbilical hernia causes recurrent symptoms.  Not interested in any surgical repair at this time.   Consults: None  Discharge Exam: Blood pressure 136/81, pulse 93, temperature 98 F (36.7 C), temperature source Oral, resp. rate 16, height 5\' 1"  (1.549 m), weight 52.3 kg, last menstrual period 09/27/2016, SpO2 100 %. General appearance: alert, cooperative and no distress GI: soft, non-tender; bowel sounds normal; no masses,  no organomegaly  Disposition:  Discharge disposition: 01-Home or Self Care       Discharge Instructions     Discharge patient   Complete by: As directed    Discharge disposition: 01-Home or Self Care   Discharge patient date: 05/28/2020     Allergies as of 05/28/2020   No Known Allergies     Medication List    STOP taking these medications   meloxicam 15 MG tablet Commonly known as: MOBIC     TAKE these medications   amoxicillin-clavulanate 875-125 MG tablet Commonly known as: Augmentin Take 1 tablet by mouth 2 (two) times daily for 7 days.   FLUoxetine 10 MG tablet Commonly known as: PROZAC Take 1 tablet (10 mg total) by mouth at bedtime.   Multi-Vitamins Tabs Take 1 tablet by mouth daily.   pantoprazole 40 MG tablet Commonly known as: Protonix Take 1 tablet (40 mg total) by mouth 2 (two) times daily.       Follow-up Information    Efrain Sella, MD. Go on 06/25/2020.   Specialty: Gastroenterology Why: 4:00PM Contact information: [address] [place] [address] [phone]                Total time spent arranging discharge was >21min. Signed: Benjamine Sprague 05/28/2020, 12:52 PM

## 2020-05-28 NOTE — Plan of Care (Signed)
  Problem: Education: Goal: Knowledge of General Education information will improve Description: Including pain rating scale, medication(s)/side effects and non-pharmacologic comfort measures 05/28/2020 1204 by Cristela Blue, RN Outcome: Progressing 05/28/2020 1203 by Cristela Blue, RN Outcome: Progressing   Problem: Health Behavior/Discharge Planning: Goal: Ability to manage health-related needs will improve 05/28/2020 1204 by Cristela Blue, RN Outcome: Progressing 05/28/2020 1203 by Cristela Blue, RN Outcome: Progressing   Problem: Clinical Measurements: Goal: Ability to maintain clinical measurements within normal limits will improve 05/28/2020 1204 by Cristela Blue, RN Outcome: Progressing 05/28/2020 1203 by Cristela Blue, RN Outcome: Progressing Goal: Will remain free from infection 05/28/2020 1204 by Cristela Blue, RN Outcome: Progressing 05/28/2020 1203 by Cristela Blue, RN Outcome: Progressing Goal: Diagnostic test results will improve 05/28/2020 1204 by Cristela Blue, RN Outcome: Progressing 05/28/2020 1203 by Cristela Blue, RN Outcome: Progressing Goal: Respiratory complications will improve 05/28/2020 1204 by Cristela Blue, RN Outcome: Progressing 05/28/2020 1203 by Cristela Blue, RN Outcome: Progressing Goal: Cardiovascular complication will be avoided 05/28/2020 1204 by Cristela Blue, RN Outcome: Progressing 05/28/2020 1203 by Cristela Blue, RN Outcome: Progressing   Problem: Activity: Goal: Risk for activity intolerance will decrease 05/28/2020 1204 by Cristela Blue, RN Outcome: Progressing 05/28/2020 1203 by Cristela Blue, RN Outcome: Progressing   Problem: Nutrition: Goal: Adequate nutrition will be maintained 05/28/2020 1204 by Cristela Blue, RN Outcome: Progressing 05/28/2020 1203 by Cristela Blue, RN Outcome: Progressing   Problem: Coping: Goal: Level of anxiety will decrease 05/28/2020 1204 by Cristela Blue, RN Outcome:  Progressing 05/28/2020 1203 by Cristela Blue, RN Outcome: Progressing   Problem: Elimination: Goal: Will not experience complications related to bowel motility 05/28/2020 1204 by Cristela Blue, RN Outcome: Progressing 05/28/2020 1203 by Cristela Blue, RN Outcome: Progressing Goal: Will not experience complications related to urinary retention 05/28/2020 1204 by Cristela Blue, RN Outcome: Progressing 05/28/2020 1203 by Cristela Blue, RN Outcome: Progressing   Problem: Pain Managment: Goal: General experience of comfort will improve 05/28/2020 1204 by Cristela Blue, RN Outcome: Progressing 05/28/2020 1203 by Cristela Blue, RN Outcome: Progressing   Problem: Safety: Goal: Ability to remain free from injury will improve 05/28/2020 1204 by Cristela Blue, RN Outcome: Progressing 05/28/2020 1203 by Cristela Blue, RN Outcome: Progressing   Problem: Skin Integrity: Goal: Risk for impaired skin integrity will decrease 05/28/2020 1204 by Cristela Blue, RN Outcome: Progressing 05/28/2020 1203 by Cristela Blue, RN Outcome: Progressing

## 2020-05-28 NOTE — Progress Notes (Signed)
Patient's home medications returned. Paper signed document stating such.

## 2020-05-28 NOTE — Discharge Instructions (Signed)
Peptic Ulcer  A peptic ulcer is a painful sore in the lining of your stomach or the first part of your small intestine. What are the causes? Common causes of this condition include:  An infection.  Using certain pain medicines too often or too much. What increases the risk? You are more likely to get this condition if you:  Smoke.  Have a family history of ulcer disease.  Drink alcohol.  Have been hospitalized in an intensive care unit (ICU). What are the signs or symptoms? Symptoms include:  Burning pain in the area between the chest and the belly button. The pain may: ? Not go away (be persistent). ? Be worse when your stomach is empty. ? Be worse at night.  Heartburn.  Feeling sick to your stomach (nauseous) and throwing up (vomiting).  Bloating. If the ulcer results in bleeding, it can cause you to:  Have poop (stool) that is black and looks like tar.  Throw up bright red blood.  Throw up material that looks like coffee grounds. How is this treated? Treatment for this condition may include:  Stopping things that can cause the ulcer, such as: ? Smoking. ? Using pain medicines.  Medicines to reduce stomach acid.  Antibiotic medicines if the ulcer is caused by an infection.  A procedure that is done using a small, flexible tube that has a camera at the end (upper endoscopy). This may be done if you have a bleeding ulcer.  Surgery. This may be needed if: ? You have a lot of bleeding. ? The ulcer caused a hole somewhere in the digestive system. Follow these instructions at home:  Do not drink alcohol if your doctor tells you not to drink.  Limit how much caffeine you take in.  Do not use any products that contain nicotine or tobacco, such as cigarettes, e-cigarettes, and chewing tobacco. If you need help quitting, ask your doctor.  Take over-the-counter and prescription medicines only as told by your doctor. ? Do not stop or change your medicines unless  you talk with your doctor about it first. ? Do not take aspirin, ibuprofen, or other NSAIDs unless your doctor told you to do so. ? START PROTONIX AS PRESCRIBED  Keep all follow-up visits as told by your doctor. This is important. Contact a doctor if:  You do not get better in 7 days after you start treatment.  You keep having an upset stomach (indigestion) or heartburn. Get help right away if:  You have sudden, sharp pain in your belly (abdomen).  You have belly pain that does not go away.  You have bloody poop (stool) or black, tarry poop.  You throw up blood. It may look like coffee grounds.  You feel light-headed or feel like you may pass out (faint).  You get weak.  You get sweaty or feel sticky and cold to the touch (clammy). Summary  Symptoms of a peptic ulcer include burning pain in the area between the chest and the belly button.  Take medicines only as told by your doctor.  Limit how much alcohol and caffeine you have.  Keep all follow-up visits as told by your doctor. This information is not intended to replace advice given to you by your health care provider. Make sure you discuss any questions you have with your health care provider. Document Revised: 08/29/2017 Document Reviewed: 08/29/2017 Elsevier Patient Education  2021 [employer].

## 2020-05-28 NOTE — Plan of Care (Signed)
Pt reported having two watery brown stools earlier in the shift. No complaint of nausea, no vomiting noted. Pt tolerating clears. Pt ambulates without problems. Safety measures in place. Will continue to monitor.  Problem: Education: Goal: Knowledge of General Education information will improve Description: Including pain rating scale, medication(s)/side effects and non-pharmacologic comfort measures Outcome: Progressing   Problem: Health Behavior/Discharge Planning: Goal: Ability to manage health-related needs will improve Outcome: Progressing   Problem: Clinical Measurements: Goal: Ability to maintain clinical measurements within normal limits will improve Outcome: Progressing Goal: Will remain free from infection Outcome: Progressing Goal: Diagnostic test results will improve Outcome: Progressing Goal: Respiratory complications will improve Outcome: Progressing Goal: Cardiovascular complication will be avoided Outcome: Progressing   Problem: Activity: Goal: Risk for activity intolerance will decrease Outcome: Progressing   Problem: Nutrition: Goal: Adequate nutrition will be maintained Outcome: Progressing   Problem: Coping: Goal: Level of anxiety will decrease Outcome: Progressing   Problem: Elimination: Goal: Will not experience complications related to bowel motility Outcome: Progressing Goal: Will not experience complications related to urinary retention Outcome: Progressing   Problem: Pain Managment: Goal: General experience of comfort will improve Outcome: Progressing   Problem: Safety: Goal: Ability to remain free from injury will improve Outcome: Progressing   Problem: Skin Integrity: Goal: Risk for impaired skin integrity will decrease Outcome: Progressing

## 2020-05-28 NOTE — Progress Notes (Signed)
This RN provided discharge instructions and teaching to the patient. The patient verbalized and demonstrated understanding of the provided instructions. All outstanding questions resolved. R and L arm PIVs removed. Both cannulas intact. Pt tolerated well. All belongings packed and in tow. Pt to discharge to private residence after consumption of lunch.

## 2020-05-29 LAB — H. PYLORI ANTIGEN, STOOL: H. Pylori Stool Ag, Eia: NEGATIVE

## 2020-05-30 LAB — CULTURE, BLOOD (ROUTINE X 2)
Culture: NO GROWTH
Culture: NO GROWTH
Special Requests: ADEQUATE

## 2020-07-23 ENCOUNTER — Other Ambulatory Visit: Payer: Self-pay | Admitting: Gastroenterology

## 2020-07-23 DIAGNOSIS — K255 Chronic or unspecified gastric ulcer with perforation: Secondary | ICD-10-CM

## 2020-08-19 DIAGNOSIS — K259 Gastric ulcer, unspecified as acute or chronic, without hemorrhage or perforation: Secondary | ICD-10-CM

## 2020-08-19 HISTORY — DX: Gastric ulcer, unspecified as acute or chronic, without hemorrhage or perforation: K25.9

## 2021-03-21 ENCOUNTER — Encounter: Payer: Self-pay | Admitting: Emergency Medicine

## 2021-03-21 ENCOUNTER — Ambulatory Visit
Admission: EM | Admit: 2021-03-21 | Discharge: 2021-03-21 | Disposition: A | Discharge status: Home / Self Care | Payer: 59 | Attending: Emergency Medicine | Admitting: Emergency Medicine

## 2021-03-21 ENCOUNTER — Other Ambulatory Visit: Payer: Self-pay

## 2021-03-21 DIAGNOSIS — J020 Streptococcal pharyngitis: Secondary | ICD-10-CM | POA: Insufficient documentation

## 2021-03-21 LAB — GROUP A STREP BY PCR: Group A Strep by PCR: DETECTED — AB

## 2021-03-21 MED ORDER — AMOXICILLIN 500 MG PO CAPS: 500.0000 mg | ORAL_CAPSULE | Freq: Two times a day (BID) | ORAL | 0 refills | Status: AC

## 2021-03-21 MED ORDER — LIDOCAINE VISCOUS HCL 2 % MT SOLN: 15.0000 mL | OROMUCOSAL | 0 refills | Status: AC | PRN

## 2021-03-21 NOTE — Discharge Instructions (Signed)
Your rapid strep test today was positive  Take amoxicillin twice a day for the next 10 days  You may gargle and spit lidocaine solution every 4 hours as needed for temporary relief of your sore throat  May use over the counter  ibuprofen in addition to Tylenol for additional comfort  You may follow-up at urgent care as needed    You may use any of the following below in addition to help manage the remaining symptoms:   For cough: honey 1/2 to 1 teaspoon (you can dilute the honey in water or another fluid).  You can also use guaifenesin and dextromethorphan for cough. You can use a humidifier for chest congestion and cough.  If you don't have a humidifier, you can sit in the bathroom with the hot shower running.      For sore throat: try warm salt water gargles, cepacol lozenges, throat spray, warm tea or water with lemon/honey, popsicles or ice, or OTC cold relief medicine for throat discomfort.   For congestion: take a daily anti-histamine like Zyrtec, Claritin, and a oral decongestant, such as pseudoephedrine.  You can also use Flonase 1-2 sprays in each nostril daily.   It is important to stay hydrated: drink plenty of fluids (water, gatorade/powerade/pedialyte, juices, or teas) to keep your throat moisturized and help further relieve irritation/discomfort.

## 2021-03-21 NOTE — ED Triage Notes (Signed)
Patient c/o pain in both ears and runny nose that started yesterday.  Patient denies fevers.

## 2021-03-21 NOTE — ED Provider Notes (Signed)
[facility]    CSN: [id number] Arrival date & time: 03/21/21  1050      History   Chief Complaint Chief Complaint  Patient presents with   Otalgia    HPI Shelly Ward is a 56 y.o. female.   Patient presents with the aches, nasal congestion, rhinorrhea, bilateral ear pain, sore throat and intermittent generalized headache for 1 day.  Sore throat has worsened, painful to swallow.  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  Has not attempted treatment of symptoms.  Denies fever, chills, body aches, coughing, shortness of breath, wheezing, abdominal pain, nausea, vomiting, diarrhea.  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   GERD (gastroesophageal reflux disease)    occ-no meds   Papilloma of right breast 01/23/2017   Scoliosis 01/23/2017    Patient Active Problem List   Diagnosis Date Noted   Perforated peptic ulcer ([place]) 05/25/2020   Breast mass, right    Depression 01/23/2017   Papilloma of right breast 01/23/2017   Scoliosis 01/23/2017   Anxiety 08/21/2014    Past Surgical History:  Procedure Laterality Date   BREAST BIOPSY Right 04/06/2017   Procedure: BREAST BIOPSY WITH NEEDLE LOCALIZATION;  Surgeon: Clayburn Pert, MD;  Location: [facility] ORS;  Service: General;  Laterality: Right;   BREAST EXCISIONAL BIOPSY     BREAST LUMPECTOMY Right 2019   intraductual papilloma   TONSILLECTOMY     WISDOM TOOTH EXTRACTION      OB History   No obstetric history on file.      Home Medications    Prior to Admission medications   Medication Sig Start Date End Date Taking? Authorizing Provider  FLUoxetine (PROZAC) 10 MG tablet Take 1 tablet (10 mg total) by mouth at bedtime. 03/16/18  Yes Juline Patch, MD  Multiple Vitamin (MULTI-VITAMINS) TABS Take 1 tablet by mouth daily.   Yes [provider]  pantoprazole  (PROTONIX) 40 MG tablet Take 1 tablet (40 mg total) by mouth 2 (two) times daily. 05/28/20 03/21/21 Yes Sakai, Isami, DO  fluticasone (FLONASE) 50 MCG/ACT nasal spray Place 1 spray into both nostrils daily as needed for allergies or rhinitis.  02/12/19  [provider]    Family History Family History  Problem Relation Age of Onset   Arthritis Mother    Diabetes Father     Social History Social History   Tobacco Use   Smoking status: Former    Packs/day: 0.50    Years: 15.00    Pack years: 7.50    Types: Cigarettes   Smokeless tobacco: Never   Tobacco comments:    1/2 pack/ day  Vaping Use   Vaping Use: Never used  Substance Use Topics   Alcohol use: No   Drug use: No     Allergies   Patient has no known allergies.   Review of Systems Review of Systems  Constitutional: Negative.   HENT:  Positive for congestion, ear pain, rhinorrhea and sore throat. Negative for dental problem, drooling, ear discharge, facial swelling, hearing loss, mouth sores, nosebleeds, postnasal drip, sinus pressure, sinus pain, sneezing, tinnitus, trouble swallowing and voice change.   Respiratory: Negative.    Gastrointestinal: Negative.   Musculoskeletal:  Positive for myalgias. Negative for arthralgias, back pain, gait problem, joint swelling, neck pain and neck stiffness.  Skin:  Negative.   Neurological:  Positive for headaches. Negative for dizziness, tremors, seizures, syncope, facial asymmetry, speech difficulty, weakness, light-headedness and numbness.    Physical Exam Triage Vital Signs ED Triage Vitals  Enc Vitals Group     BP 03/21/21 1059 124/83     Pulse Rate 03/21/21 1059 (!) 110     Resp 03/21/21 1059 14     Temp 03/21/21 1059 98.5 F (36.9 C)     Temp Source 03/21/21 1059 Oral     SpO2 03/21/21 1059 97 %     Weight 03/21/21 1055 140 lb (63.5 kg)     Height 03/21/21 1055 5\' 1"  (1.549 m)     Head Circumference --      Peak Flow --      Pain Score 03/21/21 1055 2      Pain Loc --      Pain Edu? --      Excl. in [place]? --    No data found.  Updated Vital Signs BP 124/83 (BP Location: Right Arm)    Pulse (!) 110    Temp 98.5 F (36.9 C) (Oral)    Resp 14    Ht 5\' 1"  (1.549 m)    Wt 140 lb (63.5 kg)    LMP 09/27/2016 (Approximate)    SpO2 97%    BMI 26.45 kg/m   Visual Acuity Right Eye Distance:   Left Eye Distance:   Bilateral Distance:    Right Eye Near:   Left Eye Near:    Bilateral Near:     Physical Exam Constitutional:      Appearance: Normal appearance.  HENT:     Head: Normocephalic.     Right Ear: Tympanic membrane, ear canal and external ear normal.     Left Ear: Tympanic membrane, ear canal and external ear normal.     Nose: Congestion and rhinorrhea present.     Mouth/Throat:     Mouth: Mucous membranes are moist.     Pharynx: Posterior oropharyngeal erythema present.  Eyes:     Extraocular Movements: Extraocular movements intact.  Cardiovascular:     Rate and Rhythm: Normal rate and regular rhythm.     Pulses: Normal pulses.     Heart sounds: Normal heart sounds.  Pulmonary:     Effort: Pulmonary effort is normal.     Breath sounds: Normal breath sounds.  Musculoskeletal:     Cervical back: Normal range of motion.  Lymphadenopathy:     Cervical: Cervical adenopathy present.  Skin:    General: Skin is warm and dry.  Neurological:     Mental Status: She is alert and oriented to person, place, and time. Mental status is at baseline.  Psychiatric:        Mood and Affect: Mood normal.        Behavior: Behavior normal.     UC Treatments / Results  Labs (all labs ordered are listed, but only abnormal results are displayed) Labs Reviewed - No data to display  EKG   Radiology No results found.  Procedures Procedures (including critical care time)  Medications Ordered in UC Medications - No data to display  Initial Impression / Assessment and Plan / UC Course  I have reviewed the triage vital signs and the  nursing notes.  Pertinent labs & imaging results that were available during my care of the patient were reviewed by me and considered in my medical decision making (see chart for details).  Strep pharyngitis  Amoxicillin 10-day course in viscous lidocaine prescribed for management of symptoms, may use over-the-counter ibuprofen or Tylenol in addition, may attempt use of salt water gargles, throat lozenges, warm liquids and honey for additional comfort, may use over-the-counter medications for remaining symptom management, urgent care follow-up as needed, low suspicion for of flu, will defer testing, patient endorses that she has home COVID test at home Final Clinical Impressions(s) / UC Diagnoses   Final diagnoses:  None   Discharge Instructions   None    ED Prescriptions   None    PDMP not reviewed this encounter.   Hans Eden, NP 03/21/21 1140

## 2021-09-30 ENCOUNTER — Ambulatory Visit
Admission: RE | Admit: 2021-09-30 | Discharge: 2021-09-30 | Disposition: A | Discharge status: Home / Self Care | Payer: 59 | Attending: Family Medicine | Admitting: Family Medicine

## 2021-09-30 ENCOUNTER — Ambulatory Visit
Admission: RE | Admit: 2021-09-30 | Discharge: 2021-09-30 | Disposition: A | Discharge status: Home / Self Care | Payer: 59 | Source: Ambulatory Visit | Attending: Family Medicine | Admitting: Family Medicine

## 2021-09-30 ENCOUNTER — Other Ambulatory Visit: Payer: Self-pay | Admitting: Family Medicine

## 2021-09-30 DIAGNOSIS — M79671 Pain in right foot: Secondary | ICD-10-CM

## 2021-11-02 ENCOUNTER — Ambulatory Visit: Payer: 59

## 2021-11-02 ENCOUNTER — Ambulatory Visit: Payer: 59 | Admitting: Podiatry

## 2021-11-02 DIAGNOSIS — M19071 Primary osteoarthritis, right ankle and foot: Secondary | ICD-10-CM | POA: Diagnosis not present

## 2021-11-02 DIAGNOSIS — M21619 Bunion of unspecified foot: Secondary | ICD-10-CM

## 2021-11-02 DIAGNOSIS — M19072 Primary osteoarthritis, left ankle and foot: Secondary | ICD-10-CM

## 2021-11-02 DIAGNOSIS — B351 Tinea unguium: Secondary | ICD-10-CM

## 2021-11-02 MED ORDER — CICLOPIROX 8 % EX SOLN
Freq: Every day | CUTANEOUS | 0 refills | Status: AC
Start: 2021-11-02 — End: ?

## 2021-11-04 NOTE — Progress Notes (Signed)
Subjective:  Patient ID: Shelly Ward, female    DOB: Aug 23, 1965,  MRN: [id number]  Chief Complaint  Patient presents with   Bunions    Bilateral bunions left foot is worse     56 y.o. female presents with the above complaint.  Patient presents with bilateral bunion deformity left worse than right side.  Patient states painful to touch is progressive gotten worse.  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  She wanted to get it evaluated she wanted to discuss treatment options she has not seen anyone else prior to seeing me for this.  He has been on Allegra for years and just getting worse.  She has tried some conservative care but has not exhausted everything.  Pain scale is 5 out of 10 hurts with ambulation she has secondary complaint of left hallux thickened dystrophic mycotic nail fungus on the nail she would like to discuss topical options she does not want to oral medication  Review of Systems: Negative except as noted in the HPI. Denies N/V/F/Ch.  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 Apply over nail and surrounding skin. Apply daily over previous coat. After seven (7) days, may remove with alcohol and continue cycle., Disp: 6.6 mL, Rfl: 0   FLUoxetine (PROZAC) 10 MG tablet, Take 1 tablet (10 mg total) by mouth at bedtime., Disp: 90 tablet, Rfl: 1   lidocaine (XYLOCAINE) 2 % solution, Use as directed 15 mLs in the mouth or throat as needed for mouth pain., Disp: 100 mL, Rfl: 0   Multiple Vitamin (MULTI-VITAMINS) TABS, Take 1 tablet by mouth daily., Disp: , Rfl:    pantoprazole (PROTONIX) 40 MG tablet, Take  1 tablet (40 mg total) by mouth 2 (two) times daily., Disp: 60 tablet, Rfl: 0  Social History   Tobacco Use  Smoking Status Former   Packs/day: 0.50   Years: 15.00   Total pack years: 7.50   Types: Cigarettes  Smokeless Tobacco Never  Tobacco Comments   1/2 pack/ day    No Known Allergies Objective:  There were no vitals filed for this visit. There is no height or weight on file to calculate BMI. Constitutional Well developed. Well nourished.  Vascular Dorsalis pedis pulses palpable bilaterally. Posterior tibial pulses palpable bilaterally. Capillary refill normal to all digits.  No cyanosis or clubbing noted. Pedal hair growth normal.  Neurologic Normal speech. Oriented to person, place, and time. Epicritic sensation to light touch grossly present bilaterally.  Dermatologic Nails well groomed and normal in appearance. No open wounds. No skin lesions.  Orthopedic: Normal joint ROM without pain or crepitus bilaterally. Hallux abductovalgus deformity present with underlying osteoarthritis pain with range of motion of the MTPJ joint.  Deep intra-articular pain noted.  These findings were consistent bilaterally left greater than right side Left 1st MPJ diminished range of motion. Left 1st TMT without gross hypermobility. Right 1st MPJ diminished range of motion  Right 1st TMT without gross hypermobility. Lesser digital contractures absent bilaterally.   Radiographs: Taken and reviewed. Hallux abductovalgus deformity present.  Metatarsal parabola normal. 1st/2nd IMA: Severe with underlying osteoarthritis; TSP: 5 out of 7.  Severe bunion deformity noted withAnd osteoarthritic changes.  Generalized diffuse osteopenia noted as well  Assessment:   1. Arthritis of first metatarsophalangeal (MTP) joint of left foot   2. Arthritis of first metatarsophalangeal (MTP) joint of right foot   3. Bunion   4. Nail fungus    Plan:  Patient was evaluated and treated and all questions  answered.  Hallux abductovalgus deformity, left greater than right -XR as above. -I discussed all shoe gear modifications and conservative options including orthotics management.  I discussed that she will benefit from surgical fusion of left as well as the right since the left is worse side.  She will think about the procedure will get back to me when she is ready  Onychomycosis left hallux/2 through 5 -Educated the patient on the etiology of onychomycosis and various treatment options associated with improving the fungal load.  I explained to the patient that there is 3 treatment options available to treat the onychomycosis including topical, p.o., laser treatment.  P patient like to undergo topical application.  Penlac was sent to the pharmacy of asked her to apply twice a day.  She states understanding.   No follow-ups on file.

## 2021-11-06 ENCOUNTER — Ambulatory Visit
Admission: EM | Admit: 2021-11-06 | Discharge: 2021-11-06 | Disposition: A | Discharge status: Home / Self Care | Payer: 59 | Attending: Emergency Medicine | Admitting: Emergency Medicine

## 2021-11-06 DIAGNOSIS — R8271 Bacteriuria: Secondary | ICD-10-CM | POA: Insufficient documentation

## 2021-11-06 DIAGNOSIS — S76212A Strain of adductor muscle, fascia and tendon of left thigh, initial encounter: Secondary | ICD-10-CM | POA: Diagnosis not present

## 2021-11-06 LAB — URINALYSIS, ROUTINE W REFLEX MICROSCOPIC
Bilirubin Urine: NEGATIVE
Glucose, UA: NEGATIVE mg/dL
Hgb urine dipstick: NEGATIVE
Nitrite: NEGATIVE
Protein, ur: NEGATIVE mg/dL
Specific Gravity, Urine: 1.02 (ref 1.005–1.030)
pH: 5.5 (ref 5.0–8.0)

## 2021-11-06 LAB — URINALYSIS, MICROSCOPIC (REFLEX)

## 2021-11-06 MED ORDER — METHOCARBAMOL 750 MG PO TABS: 750.0000 mg | ORAL_TABLET | Freq: Four times a day (QID) | ORAL | 0 refills | Status: DC | PRN

## 2021-11-06 NOTE — ED Provider Notes (Signed)
[facility] - [place], [place]   Name: Shelly Ward DOB: 1966/02/08 MRN: [id number] CSN: [id number] PCP: Valera Castle, MD  Arrival date and time:  11/06/21 1437  Chief Complaint:  Groin Pain   NOTE: Prior to seeing the patient today, I have reviewed the triage nursing documentation and vital signs. Clinical staff has updated patient's PMH/PSHx, current medication list, and drug allergies/intolerances to ensure comprehensive history available to assist in medical decision making.   History:   HPI: Shelly Ward is a 56 y.o. female who presents today with complaints of left-sided groin pain.  Patient states the symptoms have been worsening over the past 48 hours but started less than a week ago.  She states this pain inhibits her from walking.  She denies any urinal or vaginal symptoms.  Occasionally noticed "thick" urine.  No medications or treatments have been tried to help with her symptoms.  No known injury to her hip/thigh.   Past Medical History:  Diagnosis Date   Acute anxiety 08/21/2014   Anxiety    Arthritis    hip   Depression 01/23/2017   Dislocated hip ([place])    Right Side at Birth   Gastric ulcer 08/19/2020   Pt states that she had a Gastric Ulcer Rupture   GERD (gastroesophageal reflux disease)    occ-no meds   Papilloma of right breast 01/23/2017   Scoliosis 01/23/2017    Past Surgical History:  Procedure Laterality Date   BREAST BIOPSY Right 04/06/2017   Procedure: BREAST BIOPSY WITH NEEDLE LOCALIZATION;  Surgeon: Clayburn Pert, MD;  Location: [facility] ORS;  Service: General;  Laterality: Right;   BREAST EXCISIONAL BIOPSY     BREAST LUMPECTOMY Right 2019   intraductual papilloma   TONSILLECTOMY     TOTAL HIP ARTHROPLASTY Right 2022   WISDOM TOOTH EXTRACTION      Family History  Problem Relation Age of Onset   Arthritis Mother    Diabetes Father     Social History   Tobacco Use   Smoking status: Former    Packs/day:  0.50    Years: 15.00    Total pack years: 7.50    Types: Cigarettes   Smokeless tobacco: Never   Tobacco comments:    1/2 pack/ day  Vaping Use   Vaping Use: Never used  Substance Use Topics   Alcohol use: No   Drug use: No    Patient Active Problem List   Diagnosis Date Noted   Perforated peptic ulcer ([place]) 05/25/2020   Breast mass, right    Depression 01/23/2017   Papilloma of right breast 01/23/2017   Scoliosis 01/23/2017   Anxiety 08/21/2014    Home Medications:    No outpatient medications have been marked as taking for the 11/06/21 encounter [facility] Encounter).    Allergies:   Patient has no known allergies.  Review of Systems (ROS): Review of Systems  Constitutional:  Negative for fatigue and fever.  Genitourinary:  Negative for difficulty urinating, dyspareunia, dysuria, frequency, pelvic pain and urgency.  Musculoskeletal:  Positive for gait problem and myalgias.     Vital Signs: Today's Vitals   11/06/21 1449 11/06/21 1450 11/06/21 1454  BP:   (!) 147/77  Pulse:   (!) 107  Resp:   18  Temp:   98.2 F (36.8 C)  TempSrc:   Oral  SpO2:   98%  Weight:  145 lb (65.8 kg)   Height:  5' (1.524 m)   PainSc: 6  Physical Exam: Physical Exam Vitals and nursing note reviewed.  Constitutional:      Appearance: Normal appearance.  Cardiovascular:     Rate and Rhythm: Normal rate and regular rhythm.     Pulses: Normal pulses.     Heart sounds: Normal heart sounds.  Pulmonary:     Effort: Pulmonary effort is normal.     Breath sounds: Normal breath sounds.  Abdominal:     Hernia: There is no hernia in the right femoral area or left femoral area.  Genitourinary:    General: Normal vulva.     Comments: Abnormalities to vulva/labia Musculoskeletal:     Comments: Tenderness to groin musculature.  Tenderness more than likely to her abductor muscles.  Tenderness is consistent with an acute groin strain.  Skin:    General: Skin is warm and dry.   Neurological:     General: No focal deficit present.     Mental Status: She is alert and oriented to person, place, and time.  Psychiatric:        Mood and Affect: Mood normal.        Behavior: Behavior normal.    Urgent Care Treatments / Results:   LABS: PLEASE NOTE: all labs that were ordered this encounter are listed, however only abnormal results are displayed. Labs Reviewed  URINALYSIS, ROUTINE W REFLEX MICROSCOPIC - Abnormal; Notable for the following components:      Result Value   Ketones, ur TRACE (*)    Leukocytes,Ua SMALL (*)    All other components within normal limits  URINALYSIS, MICROSCOPIC (REFLEX) - Abnormal; Notable for the following components:   Bacteria, UA FEW (*)    All other components within normal limits  URINE CULTURE    EKG: -None  RADIOLOGY: No results found.  PROCEDURES: Procedures  MEDICATIONS RECEIVED THIS VISIT: Medications - No data to display  PERTINENT CLINICAL COURSE NOTES/UPDATES:   Initial Impression / Assessment and Plan / Urgent Care Course:  Pertinent labs & imaging results that were available during my care of the patient were personally reviewed by me and considered in my medical decision making (see lab/imaging section of note for values and interpretations).  Shelly Ward is a 56 y.o. female who presents to [facility] today with complaints of groin pain, diagnosed with groin muscle strain and bacteria in her urine, and treated as such with the medications below. NP and patient reviewed discharge instructions below during visit.   Patient is well appearing overall in clinic today. She does not appear to be in any acute distress. Presenting symptoms (see HPI) and exam as documented above.   I have reviewed the follow up and strict return precautions for any new or worsening symptoms. Patient is aware of symptoms that would be deemed urgent/emergent, and would thus require further evaluation either here or in the  emergency department. At the time of discharge, she verbalized understanding and consent with the discharge plan as it was reviewed with her. All questions were fielded by provider and/or clinic staff prior to patient discharge.    Final Clinical Impressions / Urgent Care Diagnoses:   Final diagnoses:  Inguinal strain, left, initial encounter  Bacteria in urine    New Prescriptions:   Controlled Substance Registry consulted? Not Applicable  Meds ordered this encounter  Medications   methocarbamol (ROBAXIN-750) 750 MG tablet    Sig: Take 1 tablet (750 mg total) by mouth every 6 (six) hours as needed for muscle spasms.  Dispense:  30 tablet    Refill:  0      Discharge Instructions      You were seen for groin pain and are being treated for groin strain.   - We are sending your urine out for a culture. If we need to add or change any medications, our nurse will give you a call to let you know. -You are being prescribed a muscle relaxer.  Use as needed for groin pain.  Warm compresses can also be applied to the area. -Follow-up with your primary care provider if your pain does not get better.  Take care, Dr. Marland Kitchen, NP-c     Recommended Follow up Care:  Patient encouraged to follow up with the following provider within the specified time frame, or sooner as dictated by the severity of her symptoms. As always, she was instructed that for any urgent/emergent care needs, she should seek care either here or in the emergency department for more immediate evaluation.   Gertie Baron, DNP, NP-c   Gertie Baron, NP 11/06/21 1606

## 2021-11-06 NOTE — Discharge Instructions (Addendum)
You were seen for groin pain and are being treated for groin strain.   - We are sending your urine out for a culture. If we need to add or change any medications, our nurse will give you a call to let you know. -You are being prescribed a muscle relaxer.  Use as needed for groin pain.  Warm compresses can also be applied to the area. -Follow-up with your primary care provider if your pain does not get better.  Take care, Dr. Marland Kitchen, NP-c

## 2021-11-06 NOTE — ED Triage Notes (Signed)
Pt c/o abdominal pain x2days.  Pt states that the pain is along the left side of her groin and inner thigh.  Pt states that it hurts to walk.  Pt denies any physical activity, stretching, exercise, or walking more than normal.

## 2021-11-08 LAB — URINE CULTURE: Culture: <10000 — AB

## 2021-12-14 ENCOUNTER — Other Ambulatory Visit: Payer: Self-pay | Admitting: Certified Nurse Midwife

## 2021-12-14 DIAGNOSIS — Z1231 Encounter for screening mammogram for malignant neoplasm of breast: Secondary | ICD-10-CM

## 2022-01-20 IMAGING — RF DG UGI W SINGLE CM
11 of 15 series · 14 of 20 positions shown · IV contrast (agent unspecified)
Comparison: CT 05/25/2020.

CLINICAL DATA: Perforated ulcer.

EXAM:
WATER SOLUBLE UPPER GI SERIES
TECHNIQUE: Single-column upper GI series was performed using water soluble
contrast.
CONTRAST:  Gastrografin.

[Series 1: fluoro_barium 2fps_bw · 0.17mm/px · 1 of 1 slices shown (1 of 11)]
[im 1/1]
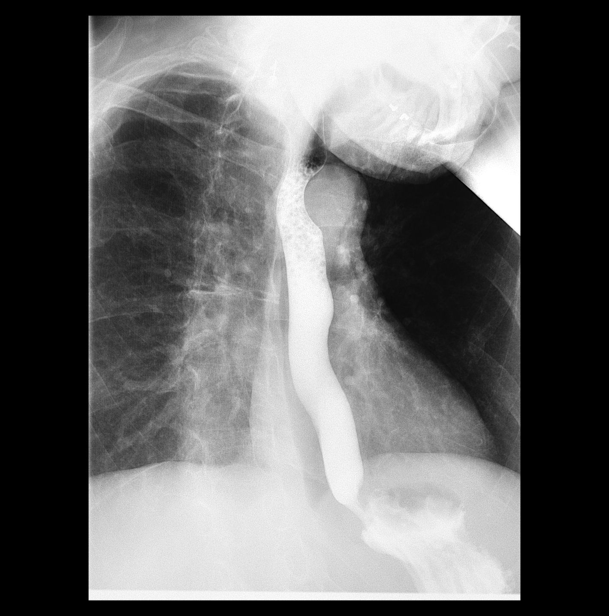

[Series 3: fluoro_barium 2fps_bw · 0.18mm/px · 1 of 1 slices shown (2 of 11)]
[im 1/1]
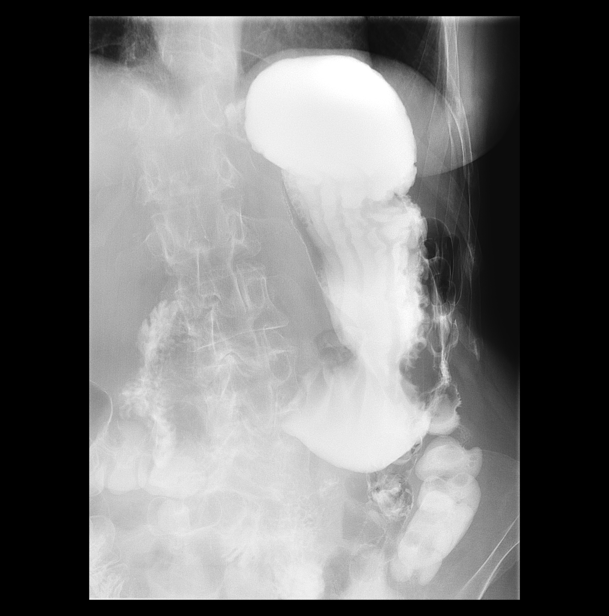

[Series 4: fluoro_barium 2fps_bw · 0.18mm/px · 1 of 2 frames shown (3 of 11)]
[frame 1/2]
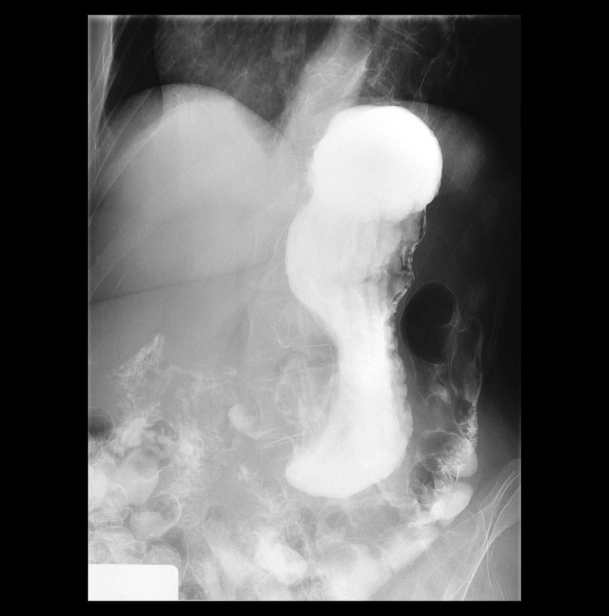

[Series 5: fluoro_barium 2fps_bw · 0.18mm/px · 2 of 2 frames shown (4 of 11)]
[frame 1/2]
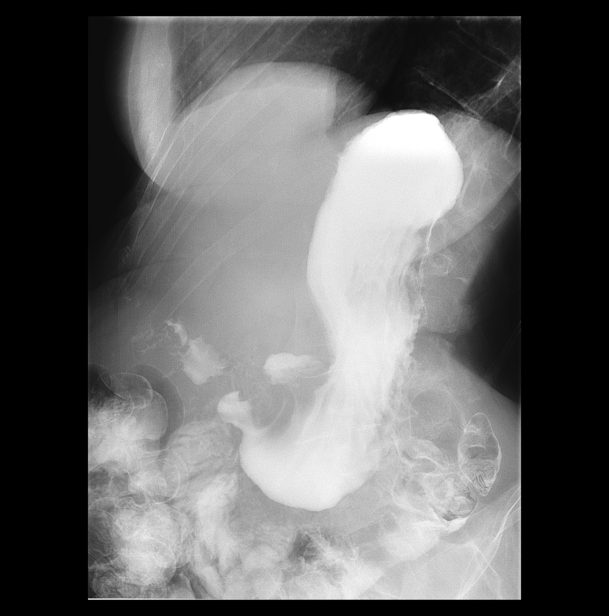
[frame 2/2]
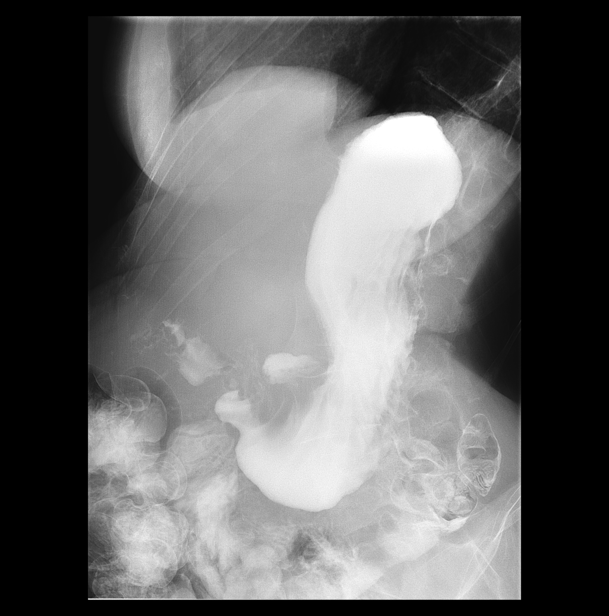

[Series 6: fluoro_barium 2fps_bw · 0.18mm/px · 1 of 1 slices shown (5 of 11)]
[im 1/1]
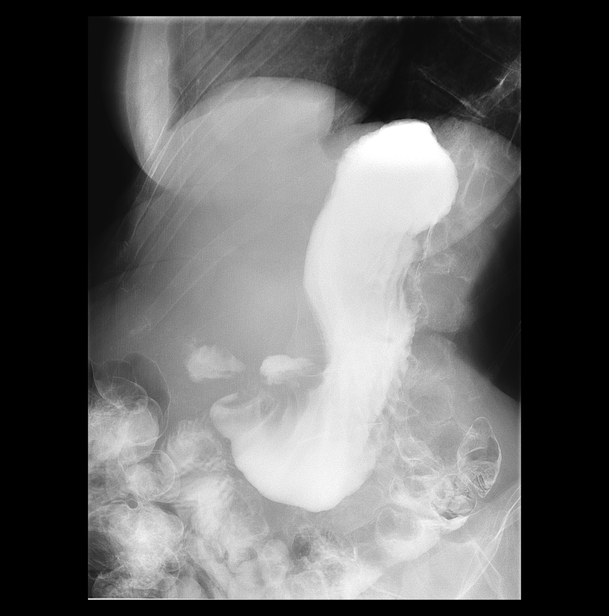

[Series 8: fluoro_barium 2fps_bw · 0.18mm/px · 2 of 2 frames shown (6 of 11)]
[frame 1/2]
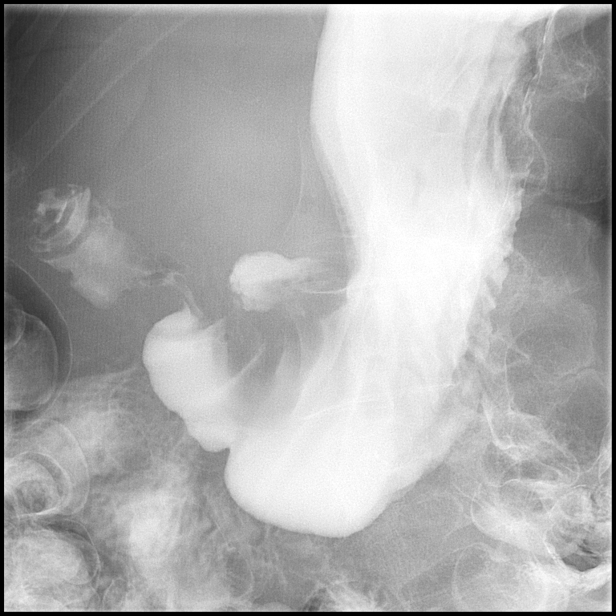
[frame 2/2]
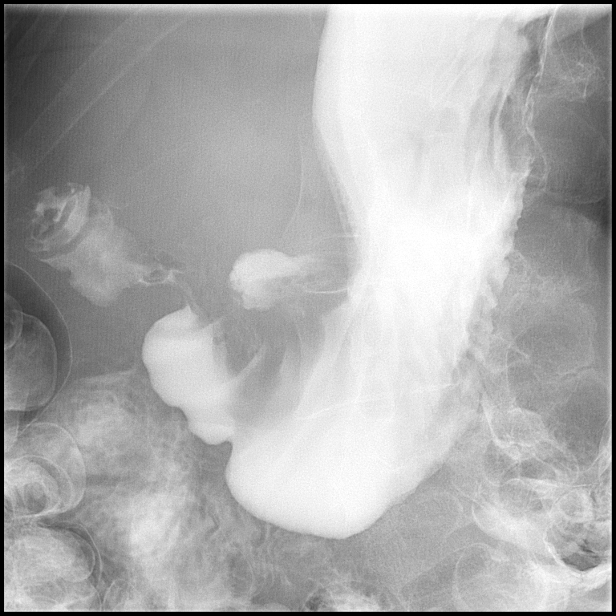

[Series 10: fluoro_barium 2fps_bw · 0.18mm/px · 2 of 2 frames shown (7 of 11)]
[frame 1/2]
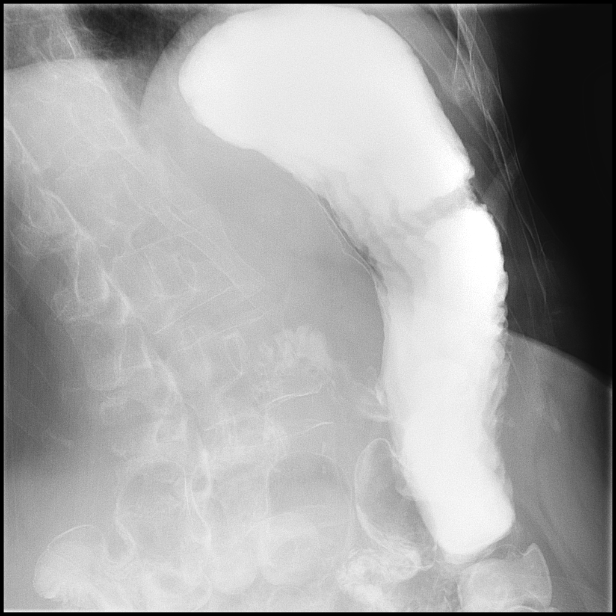
[frame 2/2]
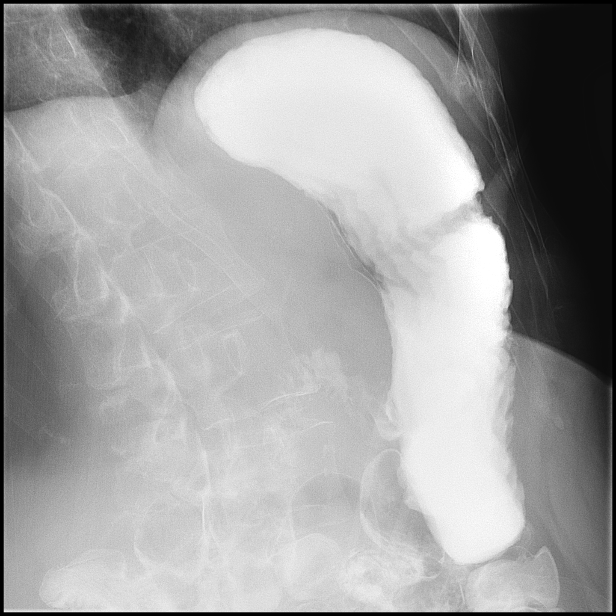

[Series 12: fluoro_barium 2fps_bw · 0.18mm/px · 1 of 1 slices shown (8 of 11)]
[im 1/1]
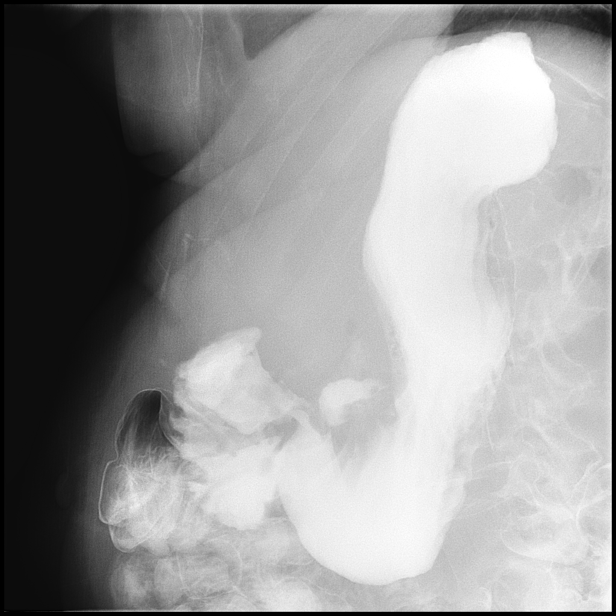

[Series 13: fluoro_barium 2fps_bw · 0.18mm/px · 1 of 1 slices shown (9 of 11)]
[im 1/1]
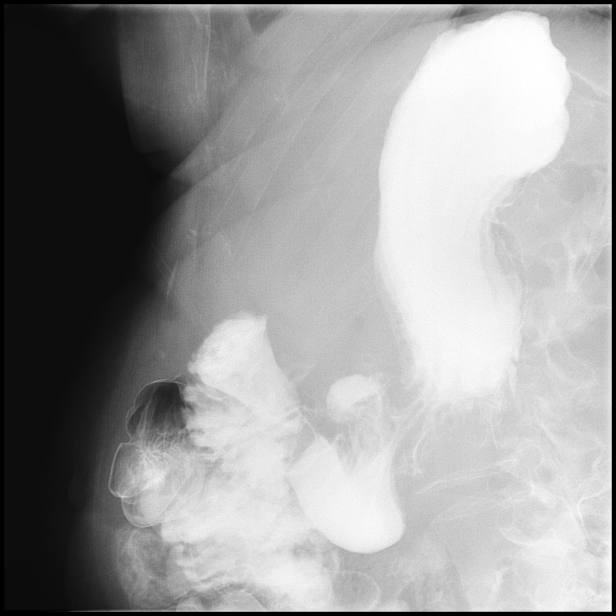

[Series 14: fluoro_barium 2fps_bw · 0.18mm/px · 1 of 2 frames shown (10 of 11)]
[frame 1/2]
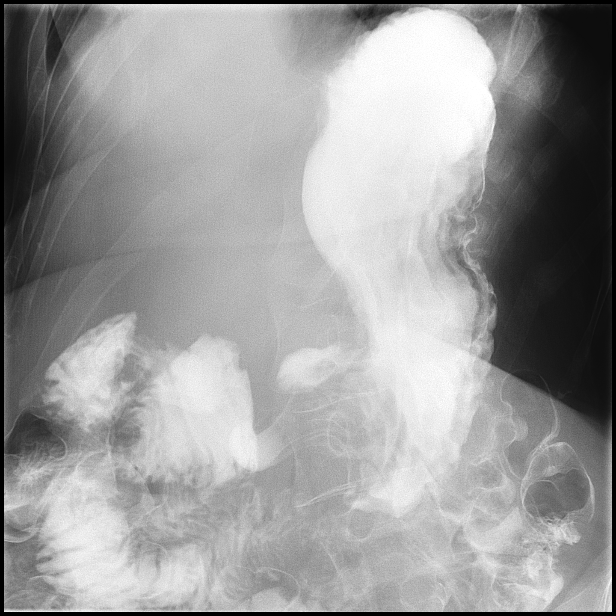

[Series 15: fluoro_barium 2fps_bw · 0.18mm/px · 1 of 1 slices shown (11 of 11)]
[im 1/1]
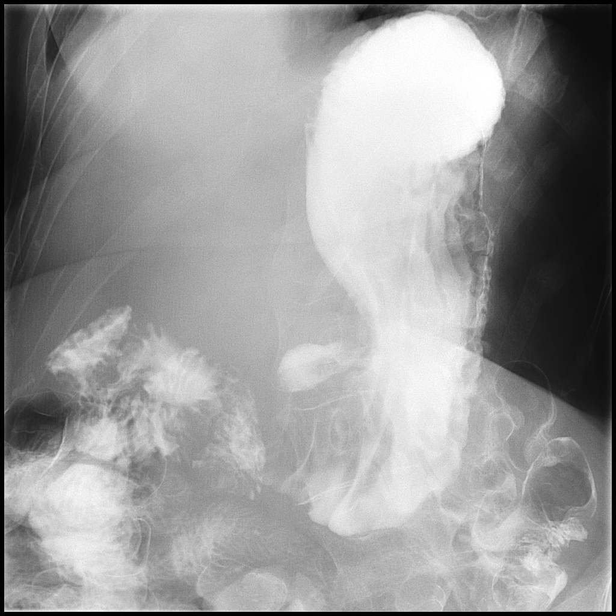

[14 of 20 positions shown; findings below may reference images not displayed]

FLUOROSCOPY TIME:  Fluoroscopy Time:  2 minutes 30 seconds

Radiation Exposure Index (if provided by the fluoroscopic device):
52.9 mGy
FINDINGS: Esophagus is widely patent. No reflux. A very large gastric
ulceration is noted along the lesser curvature of the stomach.
Duodenal bulb appears normal. Visualized C-loop appears normal.
Residual contrast noted in the bowel from prior CT.
IMPRESSION: A very large gastric ulceration is noted along the lesser curvature
of the stomach.

These results will be called to the ordering clinician or
representative by the Radiologist Assistant, and communication
documented in the PACS or [REDACTED].

## 2022-06-02 ENCOUNTER — Ambulatory Visit
Admission: EM | Admit: 2022-06-02 | Discharge: 2022-06-02 | Disposition: A | Discharge status: Home / Self Care | Payer: 59 | Attending: Family Medicine | Admitting: Family Medicine

## 2022-06-02 DIAGNOSIS — B9789 Other viral agents as the cause of diseases classified elsewhere: Secondary | ICD-10-CM | POA: Diagnosis not present

## 2022-06-02 DIAGNOSIS — J329 Chronic sinusitis, unspecified: Secondary | ICD-10-CM | POA: Diagnosis not present

## 2022-06-02 MED ORDER — IPRATROPIUM BROMIDE 0.06 % NA SOLN: 2.0000 | Freq: Four times a day (QID) | NASAL | 12 refills | Status: AC

## 2022-06-02 NOTE — ED Triage Notes (Signed)
Pt c/o HA,cough & sinus drainage x2 days. Has tried otc tylenol w/o relief.

## 2022-06-02 NOTE — ED Provider Notes (Signed)
[facility]    CSN: [id number] Arrival date & time: 06/02/22  1639      History   Chief Complaint Chief Complaint  Patient presents with   Headache   Sinus Problem    HPI Shelly Ward is a 57 y.o. female.   HPI   Shelly Ward presents for fatigue, headache and sinus pressure with sinus congestion. Endorses rhinorrhea and cough. Took Tylenol without relief. She denies vomiting, diarrhea, chest pain, nausea or abdominal pain. No known sick contacts.     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   Gastric ulcer 08/19/2020   Pt states that she had a Gastric Ulcer Rupture   GERD (gastroesophageal reflux disease)    occ-no meds   Papilloma of right breast 01/23/2017   Scoliosis 01/23/2017    Patient Active Problem List   Diagnosis Date Noted   Perforated peptic ulcer ([place]) 05/25/2020   Breast mass, right    Depression 01/23/2017   Papilloma of right breast 01/23/2017   Scoliosis 01/23/2017   Anxiety 08/21/2014    Past Surgical History:  Procedure Laterality Date   BREAST BIOPSY Right 04/06/2017   Procedure: BREAST BIOPSY WITH NEEDLE LOCALIZATION;  Surgeon: Clayburn Pert, MD;  Location: [facility] ORS;  Service: General;  Laterality: Right;   BREAST EXCISIONAL BIOPSY     BREAST LUMPECTOMY Right 2019   intraductual papilloma   TONSILLECTOMY     TOTAL HIP ARTHROPLASTY Right 2022   WISDOM TOOTH EXTRACTION      OB History   No obstetric history on file.      Home Medications    Prior to Admission medications   Medication Sig Start Date End Date Taking? Authorizing Provider  ciclopirox (PENLAC) 8 % solution Apply topically at bedtime. Apply over nail and surrounding skin. Apply daily over previous coat. After seven (7) days, may remove with alcohol and continue cycle. 11/02/21  Yes Felipa Furnace, DPM  FLUoxetine (PROZAC)  10 MG tablet Take 1 tablet (10 mg total) by mouth at bedtime. 03/16/18  Yes Juline Patch, MD  ipratropium (ATROVENT) 0.06 % nasal spray Place 2 sprays into both nostrils 4 (four) times daily. 06/02/22  Yes Finas Delone, DO  lidocaine (XYLOCAINE) 2 % solution Use as directed 15 mLs in the mouth or throat as needed for mouth pain. 03/21/21  Yes White, Leitha Schuller, NP  methocarbamol (ROBAXIN-750) 750 MG tablet Take 1 tablet (750 mg total) by mouth every 6 (six) hours as needed for muscle spasms. 11/06/21  Yes Gertie Baron, NP  Multiple Vitamin (MULTI-VITAMINS) TABS Take 1 tablet by mouth daily.   Yes [provider]  pantoprazole (PROTONIX) 40 MG tablet Take 1 tablet (40 mg total) by mouth 2 (two) times daily. 05/28/20 06/02/22 Yes Sakai, Isami, DO  fluticasone (FLONASE) 50 MCG/ACT nasal spray Place 1 spray into both nostrils daily as needed for allergies or rhinitis.  02/12/19  [provider]    Family History Family History  Problem Relation Age of Onset   Arthritis Mother    Diabetes Father     Social History Social History   Tobacco Use   Smoking status: Former    Packs/day: 0.50    Years: 15.00    Additional pack years: 0.00    Total pack years: 7.50  Types: Cigarettes   Smokeless tobacco: Never   Tobacco comments:    1/2 pack/ day  Vaping Use   Vaping Use: Never used  Substance Use Topics   Alcohol use: No   Drug use: No     Allergies   Patient has no known allergies.   Review of Systems Review of Systems: negative unless otherwise stated in HPI.      Physical Exam Triage Vital Signs ED Triage Vitals  Enc Vitals Group     BP      Pulse      Resp      Temp      Temp src      SpO2      Weight      Height      Head Circumference      Peak Flow      Pain Score      Pain Loc      Pain Edu?      Excl. in [place]?    No data found.  Updated Vital Signs BP 135/85 (BP Location: Left Arm)   Pulse (!) 117   Temp 100 F (37.8 C) (Oral)    Resp 16   Ht 5' (1.524 m)   Wt 64.4 kg   LMP 09/27/2016 (Approximate)   SpO2 95%   BMI 27.73 kg/m   Visual Acuity Right Eye Distance:   Left Eye Distance:   Bilateral Distance:    Right Eye Near:   Left Eye Near:    Bilateral Near:     Physical Exam GEN:     alert, ill but non-toxic appearing female in no distress    HENT:  mucus membranes moist, oropharyngeal without lesions or exudate, no tonsillar hypertrophy or erythema, clear nasal discharge, bilateral TM normal, no frontal or maxillary sinus tenderness EYES:   pupils equal and reactive, no scleral injection or discharge NECK:  normal ROM, no meningismus   RESP:  no increased work of breathing, clear to auscultation bilaterally CVS:   regular rhythm, tachycardic Skin:   warm and dry, no rash on visible skin    UC Treatments / Results  Labs (all labs ordered are listed, but only abnormal results are displayed) Labs Reviewed - No data to display  EKG   Radiology No results found.  Procedures Procedures (including critical care time)  Medications Ordered in UC Medications - No data to display  Initial Impression / Assessment and Plan / UC Course  I have reviewed the triage vital signs and the nursing notes.  Pertinent labs & imaging results that were available during my care of the patient were reviewed by me and considered in my medical decision making (see chart for details).       Pt is a 57 y.o. female who presents for 2 days of respiratory symptoms. [place] has an elevated temperature here of 100 F.  Satting adequately on room air. Overall pt is ill but non-toxic appearing, well hydrated, without respiratory distress. Pulmonary exam is unremarkable.  She does not have evidence of acute otitis media.  There are no exudates or tonsillar enlargement to suggest strep or acute tonsillitis.  She has no maxillary or frontal sinus tenderness to suggest acute bacterial sinusitis.  COVID testing recommended but  declined.  History most consistent with viral respiratory illness. Discussed symptomatic treatment.  Given Atrovent nasal spray for nasal congestion.  Typical duration of symptoms discussed.  Explained lack of efficacy with antibiotics in viral illnesses.  Return and ED precautions given and voiced understanding. Discussed MDM, treatment plan and plan for follow-up with patient who agrees with plan.     Final Clinical Impressions(s) / UC Diagnoses   Final diagnoses:  Viral sinusitis     Discharge Instructions      Take a COVID test when you get home.  Take Tylenol 1000 mg 3 times a day as needed. Stop by the pharmacy to pick up your prescriptions.  Follow up with your primary care provider as needed.      ED Prescriptions     Medication Sig Dispense Auth. Provider   ipratropium (ATROVENT) 0.06 % nasal spray Place 2 sprays into both nostrils 4 (four) times daily. 15 mL Lyndee Hensen, DO      PDMP not reviewed this encounter.   Lyndee Hensen, DO 06/02/22 2053

## 2022-06-02 NOTE — Discharge Instructions (Addendum)
Take a COVID test when you get home.  Take Tylenol 1000 mg 3 times a day as needed. Stop by the pharmacy to pick up your prescriptions.  Follow up with your primary care provider as needed.

## 2022-06-21 ENCOUNTER — Ambulatory Visit
Admission: RE | Admit: 2022-06-21 | Discharge: 2022-06-21 | Disposition: A | Discharge status: Home / Self Care | Payer: 59 | Source: Ambulatory Visit | Attending: Certified Nurse Midwife | Admitting: Certified Nurse Midwife

## 2022-06-21 DIAGNOSIS — Z1231 Encounter for screening mammogram for malignant neoplasm of breast: Secondary | ICD-10-CM | POA: Diagnosis present

## 2022-08-07 ENCOUNTER — Ambulatory Visit
Admission: EM | Admit: 2022-08-07 | Discharge: 2022-08-07 | Disposition: A | Discharge status: Home / Self Care | Payer: 59 | Attending: Family Medicine | Admitting: Family Medicine

## 2022-08-07 ENCOUNTER — Ambulatory Visit (INDEPENDENT_AMBULATORY_CARE_PROVIDER_SITE_OTHER): Payer: 59

## 2022-08-07 DIAGNOSIS — M545 Low back pain, unspecified: Secondary | ICD-10-CM

## 2022-08-07 DIAGNOSIS — S32000A Wedge compression fracture of unspecified lumbar vertebra, initial encounter for closed fracture: Secondary | ICD-10-CM | POA: Diagnosis not present

## 2022-08-07 MED ORDER — TIZANIDINE HCL 4 MG PO TABS: 4.0000 mg | ORAL_TABLET | Freq: Four times a day (QID) | ORAL | 0 refills | Status: AC | PRN

## 2022-08-07 MED ORDER — NAPROXEN 500 MG PO TABS: 500.0000 mg | ORAL_TABLET | Freq: Two times a day (BID) | ORAL | 0 refills | Status: AC

## 2022-08-07 NOTE — ED Triage Notes (Signed)
Pt c/o lower back pain x5 days. States she was lifting a table up her stairs & pain started after. Has tried tylenol w/o relief.

## 2022-08-07 NOTE — ED Provider Notes (Signed)
[facility]    CSN: [id number] Arrival date & time: 08/07/22  1012      History   Chief Complaint Chief Complaint  Patient presents with   Back Pain    HPI  HPI Shelly Ward is a 57 y.o. female.   [name] presents for low back pain that started 5 days ago after lifting a 50 lb table by person.  She was trying to pull a table up the steps at home. She has been taking Tylenol with little relief.  Pain worse with movement and walking. She had sharp pain so bad this morning that when she went to put the dog bowl down she dropped the bowl. The pain is stiff but can be shooting and sharp. Pain does not radiate to extremities or neck. The pain is bad at night when she turns over in the bed. Pain rated 6/10.    Fever : no  Perianal numbness: no Bowel incontinence: no Bladder incontinence: no Trauma: no  Abdominal pain: no Nausea: no Vomiting: no Dysuria:no Sleep disturbance: yes  Neck Pain: no Headache: no     Past Medical History:  Diagnosis Date   Acute anxiety 08/21/2014   Anxiety    Arthritis    hip   Depression 01/23/2017   Dislocated hip (HCC)    Right Side at Birth   Gastric ulcer 08/19/2020   Pt states that she had a Gastric Ulcer Rupture   GERD (gastroesophageal reflux disease)    occ-no meds   Papilloma of right breast 01/23/2017   Scoliosis 01/23/2017    Patient Active Problem List   Diagnosis Date Noted   Perforated peptic ulcer (HCC) 05/25/2020   Breast mass, right    Depression 01/23/2017   Papilloma of right breast 01/23/2017   Scoliosis 01/23/2017   Anxiety 08/21/2014    Past Surgical History:  Procedure Laterality Date   BREAST BIOPSY Right 04/06/2017   Procedure: BREAST BIOPSY WITH NEEDLE LOCALIZATION;  Surgeon: Ricarda Frame, MD;  Location: [facility] ORS;  Service: General;  Laterality: Right;   BREAST EXCISIONAL BIOPSY     BREAST LUMPECTOMY Right 2019   intraductual papilloma   TONSILLECTOMY     TOTAL HIP  ARTHROPLASTY Right 2022   WISDOM TOOTH EXTRACTION      OB History   No obstetric history on file.      Home Medications    Prior to Admission medications   Medication Sig Start Date End Date Taking? Authorizing Provider  ciclopirox (PENLAC) 8 % solution Apply topically at bedtime. Apply over nail and surrounding skin. Apply daily over previous coat. After seven (7) days, may remove with alcohol and continue cycle. 11/02/21  Yes Candelaria Stagers, DPM  FLUoxetine (PROZAC) 10 MG tablet Take 1 tablet (10 mg total) by mouth at bedtime. 03/16/18  Yes Duanne Limerick, MD  ipratropium (ATROVENT) 0.06 % nasal spray Place 2 sprays into both nostrils 4 (four) times daily. 06/02/22  Yes Laurianne Floresca, DO  lidocaine (XYLOCAINE) 2 % solution Use as directed 15 mLs in the mouth or throat as needed for mouth pain. 03/21/21  Yes White, Elita Boone, NP  methocarbamol (ROBAXIN-750) 750 MG tablet Take 1 tablet (750 mg total) by mouth every 6 (six) hours as needed for muscle spasms. 11/06/21  Yes Bailey Mech, NP  Multiple Vitamin (MULTI-VITAMINS) TABS Take 1 tablet by mouth daily.   Yes [provider]  pantoprazole (PROTONIX) 40 MG tablet Take 1 tablet (40 mg total) by mouth  2 (two) times daily. 05/28/20 08/07/22 Yes Sakai, Isami, DO  fluticasone (FLONASE) 50 MCG/ACT nasal spray Place 1 spray into both nostrils daily as needed for allergies or rhinitis.  02/12/19  [provider]    Family History Family History  Problem Relation Age of Onset   Arthritis Mother    Diabetes Father     Social History Social History   Tobacco Use   Smoking status: Former    Packs/day: 0.50    Years: 15.00    Additional pack years: 0.00    Total pack years: 7.50    Types: Cigarettes   Smokeless tobacco: Never   Tobacco comments:    1/2 pack/ day  Vaping Use   Vaping Use: Never used  Substance Use Topics   Alcohol use: No   Drug use: No     Allergies   Patient has no known  allergies.   Review of Systems Review of Systems: egative unless otherwise stated in HPI.      Physical Exam Triage Vital Signs ED Triage Vitals  Enc Vitals Group     BP 08/07/22 1016 (!) 143/85     Pulse Rate 08/07/22 1016 89     Resp 08/07/22 1016 16     Temp 08/07/22 1016 98.3 F (36.8 C)     Temp Source 08/07/22 1016 Oral     SpO2 08/07/22 1016 96 %     Weight 08/07/22 1016 143 lb (64.9 kg)     Height 08/07/22 1016 5' (1.524 m)     Head Circumference --      Peak Flow --      Pain Score 08/07/22 1019 7     Pain Loc --      Pain Edu? --      Excl. in GC? --    No data found.  Updated Vital Signs BP (!) 143/85 (BP Location: Left Arm)   Pulse 89   Temp 98.3 F (36.8 C) (Oral)   Resp 16   Ht 5' (1.524 m)   Wt 64.9 kg   LMP 09/27/2016 (Approximate)   SpO2 96%   BMI 27.93 kg/m   Visual Acuity Right Eye Distance:   Left Eye Distance:   Bilateral Distance:    Right Eye Near:   Left Eye Near:    Bilateral Near:     Physical Exam GEN: well appearing female in no acute distress  CVS: well perfused  RESP: speaking in full sentences without pause, no respiratory distress  MSK:  Lumbar spine: - Inspection: no gross deformity or asymmetry, swelling or ecchymosis. No skin changes  - Palpation: No TTP over the spinous processes, ***bilateral lumbar paraspinal muscles, no SI joint tenderness bilaterally - ROM: full active ROM of the lumbar spine in flexion and extension but with mild pain ***with flexion/extension  - Strength: 5/5 strength of lower extremity in L4-S1 nerve root distributions b/l - Neuro: sensation intact in the L4-S1 nerve root distribution b/l, ***2+ L4 and S1 reflexes - Special testing: Negative straight leg raise SKIN: warm, dry, no overly skin rash or erythema    UC Treatments / Results  Labs (all labs ordered are listed, but only abnormal results are displayed) Labs Reviewed - No data to display  EKG   Radiology No results  found.   Procedures Procedures (including critical care time)  Medications Ordered in UC Medications - No data to display  Initial Impression / Assessment and Plan / UC Course  I have reviewed  the triage vital signs and the nursing notes.  Pertinent labs & imaging results that were available during my care of the patient were reviewed by me and considered in my medical decision making (see chart for details).      Pt is a 57 y.o.  female with *** days of *** back pain after ***.    Exam is concerning for a ***.   Obtained *** plain films.  Xray personally interpreted by me were ***unremarkable for fracture, ***malalignment or dislocation.  Radiologist port reviewed and agrees***/notes ***  Patient to gradually return to normal activities, as tolerated and continue ordinary activities within the limits permitted by pain. Prescribed Naproxen sodium *** and muscle relaxer *** for pain relief.  Advised patient to avoid other NSAIDs while taking ***Naprosyn. Tylenol and Lidocaine patches PRN for multimodal pain relief. Counseled patient on red flag symptoms and when to seek immediate care.  ***No red flags suggesting cauda equina syndrome or progressive major motor weakness. Patient to follow up with orthopedic provider if symptoms do not improve with conservative treatment.  Return and ED precautions given.    Discussed MDM, treatment plan and plan for follow-up with patient/parent who agrees with plan.   Final Clinical Impressions(s) / UC Diagnoses   Final diagnoses:  None   Discharge Instructions   None    ED Prescriptions   None    PDMP not reviewed this encounter.

## 2022-08-07 NOTE — Discharge Instructions (Addendum)
Follow up with [facility] for a back brace for new  compression deformities seen on your xray today.   If medication was prescribed, stop by the pharmacy to pick up your prescriptions.  For your back pain, Take 16109 mg Tylenol 2-3 times a day, take muscle relaxer (Tizanidine ) 2-3 times a day, take Naprosyn twice a day,  as needed for pain. Consider stopping by the pharmacy or dollar store to pick up some Lidocaine patches. Apply for 12 hours and then remove.   Watch for worsening symptoms such as an increasing weakness or loss of sensation in your arms or legs, increasing pain and/or the loss of bladder or bowel function. Should any of these occur, go to the emergency department immediately.

## 2023-01-05 ENCOUNTER — Ambulatory Visit
Admission: EM | Admit: 2023-01-05 | Discharge: 2023-01-05 | Disposition: A | Discharge status: Home / Self Care | Payer: 59 | Attending: Physician Assistant | Admitting: Physician Assistant

## 2023-01-05 DIAGNOSIS — R059 Cough, unspecified: Secondary | ICD-10-CM | POA: Diagnosis not present

## 2023-01-05 DIAGNOSIS — R490 Dysphonia: Secondary | ICD-10-CM | POA: Insufficient documentation

## 2023-01-05 DIAGNOSIS — Z87891 Personal history of nicotine dependence: Secondary | ICD-10-CM | POA: Insufficient documentation

## 2023-01-05 DIAGNOSIS — Z1152 Encounter for screening for COVID-19: Secondary | ICD-10-CM | POA: Diagnosis not present

## 2023-01-05 DIAGNOSIS — J029 Acute pharyngitis, unspecified: Secondary | ICD-10-CM | POA: Diagnosis present

## 2023-01-05 LAB — SARS CORONAVIRUS 2 BY RT PCR: SARS Coronavirus 2 by RT PCR: NEGATIVE

## 2023-01-05 LAB — GROUP A STREP BY PCR: Group A Strep by PCR: NOT DETECTED

## 2023-01-05 MED ORDER — IPRATROPIUM BROMIDE 0.06 % NA SOLN: 2.0000 | Freq: Four times a day (QID) | NASAL | 0 refills | Status: AC

## 2023-01-05 MED ORDER — LIDOCAINE VISCOUS HCL 2 % MT SOLN
15.0000 mL | OROMUCOSAL | 0 refills | Status: AC | PRN
Start: 2023-01-05 — End: ?

## 2023-01-05 MED ORDER — PROMETHAZINE-DM 6.25-15 MG/5ML PO SYRP: 5.0000 mL | ORAL_SOLUTION | Freq: Four times a day (QID) | ORAL | 0 refills | Status: AC | PRN

## 2023-01-05 NOTE — Discharge Instructions (Signed)
-  Strep negative. - Obtaining COVID test.  Will call if its positive.  If for COVID should isolate about 5 days and wear mask for 5 days. - Your symptoms just started so you may develop cough, more congestion, more fatigue.  Most viral infections get better within 1 to 2 weeks.  If you develop fever or breathing issue you should be seen again.

## 2023-01-05 NOTE — ED Triage Notes (Addendum)
Patient presents with sore throat, loss of voice and achy legs that started last night. Treated with Tylenol. 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

## 2023-01-05 NOTE — ED Provider Notes (Signed)
[facility]    CSN: [id number] Arrival date & time: 01/05/23  1600      History   Chief Complaint Chief Complaint  Patient presents with   Sore Throat    HPI Shelly Ward is a 57 y.o. female presenting for sore throat, voice hoarseness, slight congestion and mild cough.  Patient says symptoms suddenly started last night.  Denies fever, body aches, chest pain, wheezing, shortness of breath, abdominal pain, vomiting or diarrhea.  No known exposure to COVID, flu or strep but does work in a daycare.  Has taken Tylenol.  No other complaints.  HPI  Past Medical History:  Diagnosis Date   Acute anxiety 08/21/2014   Anxiety    Arthritis    hip   Depression 01/23/2017   Dislocated hip (HCC)    Right Side at Birth   Gastric ulcer 08/19/2020   Pt states that she had a Gastric Ulcer Rupture   GERD (gastroesophageal reflux disease)    occ-no meds   Papilloma of right breast 01/23/2017   Scoliosis 01/23/2017    Patient Active Problem List   Diagnosis Date Noted   Perforated peptic ulcer (HCC) 05/25/2020   Breast mass, right    Depression 01/23/2017   Papilloma of right breast 01/23/2017   Scoliosis 01/23/2017   Anxiety 08/21/2014    Past Surgical History:  Procedure Laterality Date   BREAST BIOPSY Right 04/06/2017   Procedure: BREAST BIOPSY WITH NEEDLE LOCALIZATION;  Surgeon: Ricarda Frame, MD;  Location: [facility] ORS;  Service: General;  Laterality: Right;   BREAST EXCISIONAL BIOPSY     BREAST LUMPECTOMY Right 2019   intraductual papilloma   TONSILLECTOMY     TOTAL HIP ARTHROPLASTY Right 2022   WISDOM TOOTH EXTRACTION      OB History   No obstetric history on file.      Home Medications    Prior to Admission medications   Medication Sig Start Date End Date Taking? Authorizing Provider  ipratropium (ATROVENT) 0.06 % nasal spray Place 2 sprays into both nostrils 4 (four) times daily. 01/05/23  Yes Eusebio Friendly B, PA-C  lidocaine (XYLOCAINE)  2 % solution Use as directed 15 mLs in the mouth or throat every 3 (three) hours as needed for mouth pain (swish and spit). 01/05/23  Yes Shirlee Latch, PA-C  promethazine-dextromethorphan (PROMETHAZINE-DM) 6.25-15 MG/5ML syrup Take 5 mLs by mouth 4 (four) times daily as needed. 01/05/23  Yes Eusebio Friendly B, PA-C  ciclopirox (PENLAC) 8 % solution Apply topically at bedtime. Apply over nail and surrounding skin. Apply daily over previous coat. After seven (7) days, may remove with alcohol and continue cycle. 11/02/21   Candelaria Stagers, DPM  FLUoxetine (PROZAC) 10 MG tablet Take 1 tablet (10 mg total) by mouth at bedtime. 03/16/18   Duanne Limerick, MD  ipratropium (ATROVENT) 0.06 % nasal spray Place 2 sprays into both nostrils 4 (four) times daily. 06/02/22   Brimage, Seward Meth, DO  lidocaine (XYLOCAINE) 2 % solution Use as directed 15 mLs in the mouth or throat as needed for mouth pain. 03/21/21   Valinda Hoar, NP  Multiple Vitamin (MULTI-VITAMINS) TABS Take 1 tablet by mouth daily.    [provider]  naproxen (NAPROSYN) 500 MG tablet Take 1 tablet (500 mg total) by mouth 2 (two) times daily with a meal. 08/07/22   Brimage, Vondra, DO  pantoprazole (PROTONIX) 40 MG tablet Take 1 tablet (40 mg total) by mouth 2 (two) times daily. 05/28/20  08/07/22  Tonna Boehringer, Isami, DO  tiZANidine (ZANAFLEX) 4 MG tablet Take 1 tablet (4 mg total) by mouth every 6 (six) hours as needed for muscle spasms. 08/07/22   Brimage, Seward Meth, DO  fluticasone (FLONASE) 50 MCG/ACT nasal spray Place 1 spray into both nostrils daily as needed for allergies or rhinitis.  02/12/19  [provider]    Family History Family History  Problem Relation Age of Onset   Arthritis Mother    Diabetes Father     Social History Social History   Tobacco Use   Smoking status: Former    Current packs/day: 0.50    Average packs/day: 0.5 packs/day for 15.0 years (7.5 ttl pk-yrs)    Types: Cigarettes   Smokeless tobacco: Never    Tobacco comments:    1/2 pack/ day  Vaping Use   Vaping status: Never Used  Substance Use Topics   Alcohol use: No   Drug use: No     Allergies   Patient has no known allergies.   Review of Systems Review of Systems  Constitutional:  Positive for fatigue. Negative for chills, diaphoresis and fever.  HENT:  Positive for congestion, rhinorrhea, sore throat and voice change. Negative for ear pain, sinus pressure and sinus pain.   Respiratory:  Positive for cough. Negative for shortness of breath.   Gastrointestinal:  Negative for abdominal pain, nausea and vomiting.  Musculoskeletal:  Positive for myalgias.  Skin:  Negative for rash.  Neurological:  Negative for weakness and headaches.  Hematological:  Negative for adenopathy.     Physical Exam Triage Vital Signs ED Triage Vitals  Encounter Vitals Group     BP 01/05/23 1639 131/80     Systolic BP Percentile --      Diastolic BP Percentile --      Pulse Rate 01/05/23 1639 99     Resp --      Temp 01/05/23 1639 97.8 F (36.6 C)     Temp Source 01/05/23 1639 Oral     SpO2 01/05/23 1639 100 %     Weight 01/05/23 1638 150 lb (68 kg)     Height 01/05/23 1638 5\' 1"  (1.549 m)     Head Circumference --      Peak Flow --      Pain Score 01/05/23 1636 5     Pain Loc --      Pain Education --      Exclude from Growth Chart --    No data found.  Updated Vital Signs BP 131/80 (BP Location: Left Arm)   Pulse 99   Temp 97.8 F (36.6 C) (Oral)   Ht 5\' 1"  (1.549 m)   Wt 150 lb (68 kg)   LMP 09/27/2016 (Approximate)   SpO2 100%   BMI 28.34 kg/m   Physical Exam Vitals and nursing note reviewed.  Constitutional:      General: She is not in acute distress.    Appearance: Normal appearance. She is not ill-appearing or toxic-appearing.     Comments: Voice is hoarse  HENT:     Head: Normocephalic and atraumatic.     Nose: Congestion present.     Mouth/Throat:     Mouth: Mucous membranes are dry.     Pharynx: Oropharynx  is clear. Posterior oropharyngeal erythema present.  Eyes:     General: No scleral icterus.       Right eye: No discharge.        Left eye: No discharge.  Conjunctiva/sclera: Conjunctivae normal.  Cardiovascular:     Rate and Rhythm: Normal rate and regular rhythm.     Heart sounds: Normal heart sounds.  Pulmonary:     Effort: Pulmonary effort is normal. No respiratory distress.     Breath sounds: Normal breath sounds.  Musculoskeletal:     Cervical back: Neck supple.  Skin:    General: Skin is dry.  Neurological:     General: No focal deficit present.     Mental Status: She is alert. Mental status is at baseline.     Motor: No weakness.     Gait: Gait normal.  Psychiatric:        Mood and Affect: Mood normal.        Behavior: Behavior normal.      UC Treatments / Results  Labs (all labs ordered are listed, but only abnormal results are displayed) Labs Reviewed  GROUP A STREP BY PCR  SARS CORONAVIRUS 2 BY RT PCR    EKG   Radiology No results found.  Procedures Procedures (including critical care time)  Medications Ordered in UC Medications - No data to display  Initial Impression / Assessment and Plan / UC Course  I have reviewed the triage vital signs and the nursing notes.  Pertinent labs & imaging results that were available during my care of the patient were reviewed by me and considered in my medical decision making (see chart for details).   57 year old female presents for sore throat, aches, fatigue, slight cough, voice hoarseness, mild congestion since last night.  No fever.  Vitals normal and stable and patient is overall well-appearing.  Voice is worse.  Mild nasal congestion and mild posterior pharyngeal erythema.  Mouth is dry.  Chest clear to auscultation.  PCR strep negative.  COVID testing obtained.  Reviewed current CDC guidelines, isolation medical and ED precautions if COVID is positive.  Advised her that we will contact her if COVID is  positive and recommend further treatment.  Viral URI.  Supportive care encouraged increasing rest and fluids.  I sent Promethazine DM, viscous lidocaine and Atrovent nasal spray.  Reviewed return and ED precautions.  Negative COVID.  Final Clinical Impressions(s) / UC Diagnoses   Final diagnoses:  Viral pharyngitis  Voice hoarseness     Discharge Instructions      -Strep negative. - Obtaining COVID test.  Will call if its positive.  If for COVID should isolate about 5 days and wear mask for 5 days. - Your symptoms just started so you may develop cough, more congestion, more fatigue.  Most viral infections get better within 1 to 2 weeks.  If you develop fever or breathing issue you should be seen again.     ED Prescriptions     Medication Sig Dispense Auth. Provider   promethazine-dextromethorphan (PROMETHAZINE-DM) 6.25-15 MG/5ML syrup Take 5 mLs by mouth 4 (four) times daily as needed. 118 mL Eusebio Friendly B, PA-C   lidocaine (XYLOCAINE) 2 % solution Use as directed 15 mLs in the mouth or throat every 3 (three) hours as needed for mouth pain (swish and spit). 100 mL Eusebio Friendly B, PA-C   ipratropium (ATROVENT) 0.06 % nasal spray Place 2 sprays into both nostrils 4 (four) times daily. 15 mL Shirlee Latch, PA-C      PDMP not reviewed this encounter.   Shirlee Latch, PA-C 01/05/23 [phone]

## 2023-12-12 ENCOUNTER — Ambulatory Visit: Admitting: Podiatry

## 2023-12-12 DIAGNOSIS — M2012 Hallux valgus (acquired), left foot: Secondary | ICD-10-CM

## 2023-12-12 NOTE — Progress Notes (Signed)
 Subjective:  Patient ID: Shelly Ward, female    DOB: [birth year],  MRN: [id number]  Chief Complaint  Patient presents with   Toe Pain    Left foot toe pain     58 y.o. female presents with the above complaint.  Patient presents with follow-up of left-sided bunion deformity continues to be problematic.  She wants to think about surgery for next year.  Has not seen anyone else prior to seeing me denies any other acute complaints  Review of Systems: Negative except as noted in the HPI. Denies N/V/F/Ch.  Past Medical History:  Diagnosis Date   Acute anxiety 08/21/2014   Anxiety    Arthritis    hip   Depression 01/23/2017   Dislocated hip (HCC)    Right Side at Birth   Gastric ulcer 08/19/2020   Pt states that she had a Gastric Ulcer Rupture   GERD (gastroesophageal reflux disease)    occ-no meds   Papilloma of right breast 01/23/2017   Scoliosis 01/23/2017    Current Outpatient Medications:    ciclopirox  (PENLAC ) 8 % solution, Apply topically at bedtime. Apply over nail and surrounding skin. Apply daily over previous coat. After seven (7) days, may remove with alcohol and continue cycle., Disp: 6.6 mL, Rfl: 0   FLUoxetine  (PROZAC ) 10 MG tablet, Take 1 tablet (10 mg total) by mouth at bedtime., Disp: 90 tablet, Rfl: 1   ipratropium (ATROVENT ) 0.06 % nasal spray, Place 2 sprays into both nostrils 4 (four) times daily., Disp: 15 mL, Rfl: 12   ipratropium (ATROVENT ) 0.06 % nasal spray, Place 2 sprays into both nostrils 4 (four) times daily., Disp: 15 mL, Rfl: 0   lidocaine  (XYLOCAINE ) 2 % solution, Use as directed 15 mLs in the mouth or throat as needed for mouth pain., Disp: 100 mL, Rfl: 0   lidocaine  (XYLOCAINE ) 2 % solution, Use as directed 15 mLs in the mouth or throat every 3 (three) hours as needed for mouth pain (swish and spit)., Disp: 100 mL, Rfl: 0   Multiple Vitamin (MULTI-VITAMINS) TABS, Take 1 tablet by mouth daily., Disp: , Rfl:    naproxen  (NAPROSYN ) 500 MG  tablet, Take 1 tablet (500 mg total) by mouth 2 (two) times daily with a meal., Disp: 30 tablet, Rfl: 0   pantoprazole  (PROTONIX ) 40 MG tablet, Take 1 tablet (40 mg total) by mouth 2 (two) times daily., Disp: 60 tablet, Rfl: 0   promethazine -dextromethorphan (PROMETHAZINE -DM) 6.25-15 MG/5ML syrup, Take 5 mLs by mouth 4 (four) times daily as needed., Disp: 118 mL, Rfl: 0   tiZANidine  (ZANAFLEX ) 4 MG tablet, Take 1 tablet (4 mg total) by mouth every 6 (six) hours as needed for muscle spasms., Disp: 30 tablet, Rfl: 0  Social History   Tobacco Use  Smoking Status Former   Current packs/day: 0.50   Average packs/day: 0.5 packs/day for 15.0 years (7.5 ttl pk-yrs)   Types: Cigarettes  Smokeless Tobacco Never  Tobacco Comments   1/2 pack/ day    Allergies  Allergen Reactions   Nsaids Other (See Comments)    Hx of GI bleed   Objective:  There were no vitals filed for this visit. There is no height or weight on file to calculate BMI. Constitutional Well developed. Well nourished.  Vascular Dorsalis pedis pulses palpable bilaterally. Posterior tibial pulses palpable bilaterally. Capillary refill normal to all digits.  No cyanosis or clubbing noted. Pedal hair growth normal.  Neurologic Normal speech. Oriented to person, place, and time. Epicritic sensation  to light touch grossly present bilaterally.  Dermatologic Nails well groomed and normal in appearance. No open wounds. No skin lesions.  Orthopedic: Normal joint ROM without pain or crepitus bilaterally. Hallux abductovalgus deformity present with underlying osteoarthritis pain with range of motion of the MTPJ joint.  Deep intra-articular pain noted.  These findings were consistent bilaterally left greater than right side Left 1st MPJ diminished range of motion. Left 1st TMT without gross hypermobility. Right 1st MPJ diminished range of motion  Right 1st TMT without gross hypermobility. Lesser digital contractures absent  bilaterally.   Radiographs: Taken and reviewed. Hallux abductovalgus deformity present. Metatarsal parabola normal. 1st/2nd IMA: Severe with underlying osteoarthritis; TSP: 5 out of 7.  Severe bunion deformity noted withAnd osteoarthritic changes.  Generalized diffuse osteopenia noted as well  Assessment:   1. Hav (hallux abducto valgus), left     Plan:  Patient was evaluated and treated and all questions answered.  Hallux abductovalgus deformity, left greater than right -XR as above. -I discussed all shoe gear modifications and conservative options including orthotics management.  I discussed that she will benefit from surgical fusion of left as well as the right since the left is worse side.  She will think about the procedure will get back to me when she is ready  Onychomycosis left hallux/2 through 5 -Educated the patient on the etiology of onychomycosis and various treatment options associated with improving the fungal load.  I explained to the patient that there is 3 treatment options available to treat the onychomycosis including topical, p.o., laser treatment.  Patient will think about the option will get back to me when she is ready  No follow-ups on file.
